# Patient Record
Sex: Male | Born: 1960 | Race: White | Hispanic: No | Smoking: Current every day smoker
Health system: Southern US, Community
[De-identification: ages and names within clinical notes are randomized; demographics above are authoritative.]

## PROBLEM LIST (undated history)

## (undated) DIAGNOSIS — M069 Rheumatoid arthritis, unspecified: Secondary | ICD-10-CM

## (undated) DIAGNOSIS — F10939 Alcohol use, unspecified with withdrawal, unspecified: Secondary | ICD-10-CM

## (undated) DIAGNOSIS — R569 Unspecified convulsions: Secondary | ICD-10-CM

## (undated) DIAGNOSIS — F1010 Alcohol abuse, uncomplicated: Secondary | ICD-10-CM

## (undated) DIAGNOSIS — F10239 Alcohol dependence with withdrawal, unspecified: Secondary | ICD-10-CM

## (undated) HISTORY — PX: KNEE SURGERY: SHX244

## (undated) HISTORY — DX: Rheumatoid arthritis, unspecified: M06.9

## (undated) HISTORY — PX: ANKLE SURGERY: SHX546

## (undated) HISTORY — PX: OTHER SURGICAL HISTORY: SHX169

## (undated) HISTORY — PX: LUMBAR SPINE SURGERY: SHX701

---

## 2001-02-12 ENCOUNTER — Encounter: Payer: Self-pay | Admitting: Emergency Medicine

## 2001-02-12 ENCOUNTER — Emergency Department (HOSPITAL_COMMUNITY): Admission: EM | Admit: 2001-02-12 | Discharge: 2001-02-12 | Payer: Self-pay

## 2002-08-26 ENCOUNTER — Encounter: Payer: Self-pay | Admitting: Orthopaedic Surgery

## 2002-08-26 ENCOUNTER — Ambulatory Visit (HOSPITAL_COMMUNITY): Admission: RE | Admit: 2002-08-26 | Discharge: 2002-08-26 | Payer: Self-pay | Admitting: Orthopaedic Surgery

## 2002-09-05 ENCOUNTER — Encounter: Payer: Self-pay | Admitting: Orthopedic Surgery

## 2002-09-05 ENCOUNTER — Encounter: Admission: RE | Admit: 2002-09-05 | Discharge: 2002-09-05 | Payer: Self-pay | Admitting: Orthopedic Surgery

## 2006-10-05 ENCOUNTER — Inpatient Hospital Stay (HOSPITAL_COMMUNITY): Admission: RE | Admit: 2006-10-05 | Discharge: 2006-10-07 | Payer: Self-pay | Admitting: Orthopaedic Surgery

## 2010-08-31 NOTE — Op Note (Signed)
NAME:  Larry Lozano, Larry Lozano NO.:  [id number]   MEDICAL RECORD NO.:  [id number]          PATIENT TYPE:  INP   LOCATION:  5010                         FACILITY:  [facility]   PHYSICIAN:  Sharolyn Douglas, M.D.        DATE OF BIRTH:  07/14/1960   DATE OF PROCEDURE:  10/05/2006  DATE OF DISCHARGE:                               OPERATIVE REPORT   PREOPERATIVE DIAGNOSES:  1. Lumbar degenerative disk disease.  2. Lumbar spondylosis  3. Lumbar spinal stenosis.  4. Chronic back and severe right leg pain.   PROCEDURE:  1. L3-4 and L4-5 laminectomy.  2. L3-4, L4-5 posterior spinal fusion.  3. L3-4 and L4-5 transforaminal lumbar interbody fusion with placement      of two 11-mm PEEK cages.  4. Segmental pedicle screw instrumentation L3-L5 using the Abbott      spine system.  5. Local autogenous bone graft supplemented with 10 mL of Vitoss bone      graft substitute and OP-1 BMP.  6. Bone marrow aspiration from the pedicles for spinal fusion.   SURGEON:  Sharolyn Douglas, M.D.   ASSISTANT:  Jill Side Mahar, PA   ANESTHESIA:  General endotracheal.   ESTIMATED BLOOD LOSS:  300 mL   COMPLICATIONS:  None.   COUNTS:  Needle and sponge count correct.   INDICATIONS:  The patient is a pleasant 50 year old male with chronic  progressive back and severe right leg pain.  He has failed other  attempts at conservative treatment.  His imaging studies show severe  spondylosis, most advanced at L3-4, L4-5 with degenerative scoliosis,  lateral recess and foraminal narrowing on the right side.  He now  presents for lumbar decompression and fusion from L3 to L5 in hopes of  improving his symptoms.  Risk, benefits, alternative reviewed.   </   DESCRIPTION OF PROCEDURE:  After informed consent he was taken to the  operating room.  He underwent general endotracheal anesthesia without  difficulty, given prophylactic IV antibiotics.  Neuro monitoring  established in the form of lower extremity EMGs and  SSEPs.  Carefully  turned prone onto the Wilson frame.  All bony prominences padded.  Face  and eyes protected at all times.  Back prepped, draped in the usual  sterile fashion.  Midline incision made from L3 down to L5.  Dissection  was carried to the deep fascia.  Careful subperiosteal exposure carried  out to the tips of the transverse processes of L3, L4 and L5  bilaterally.  Deep retractors placed.  Intraoperative x-ray was taken to  confirm the levels.  We then used loupes and headlight magnification to  complete a hemilaminotomy on the right side at L3-4 and L4-5.  The  ligamentum flavum was removed.  The lamina were undercut proximally and  across the midline, decompressing the thecal sac.  We identified the L4  and L5 nerve roots which were severely encumbered within the lateral  recesses.  The decompression was carried out flush with the pedicles.  The disks were bulging dorsally, further compromising the lateral recess  and also the neural foramen  at L3-4 and L4-5.  Once we were satisfied  with the laminectomy, we turned our attention to placing pedicle screws  at L3, L4 and L5 bilaterally using an anatomic probing technique.  The  pedicle holes were initiated with the awl.  The pedicles were cannulated  and then palpated with a ball feeler.  There were no breeches.  We then  tapped the pedicles and once again palpated, confirming no breeches.  We  placed 6.5 by a 45 mm screws.  We had good screw purchase.  After  placing the pedicle screws,  they were stimulated using EMGs.  We had  low readings on the left side at L3-L4.  Those screws were removed.  Once again palpated and again we did not detect any breeches.  The  screws were replaced.   At this point we turned our attention to performing transforaminal  lumbar interbody fusions at L3-4 and L4-5 on the right side to further  decompress the lateral recess and foramen and also provide additional  fusion bed.  The remaining  facette joints at L3-4 and  L4-5 were  osteotomized.  Free running EMGs were monitored.  The transforaminal  windows were created between the exiting and transversing nerve roots.  The structures were protected at all times.  The disk spaces were  entered and radical diskectomies were completed at both levels, scraping  the cartilaginous end plates.  We then dilated the disk up to 11 mm  using intervertebral spreaders.  The disk spaces were then packed with a  mixture of the Vitoss OP-1 BMP and local bone graft.  We then inserted  11 mm PEEK cages packed with the BMP/Vitoss mixture into the interspace,  tamped in the PEEK cages anteriorly and across the midline.  This was  performed at both the L3-4 and L4-5 levels.  We then completed the  posterior spinal fusion by decorticating the transverse processes of L3,  L4 and L5 and packing the remaining bone graft mixture into the lateral  gutters.  We placed 70 mm titanium rods into the polyaxial screw heads  and applied compression before shearing off the locking caps.  A cross  connector was placed.  Hemostasis was achieved.  Intraoperative x-ray  was taken which confirmed good positioning of the instrumentation from  L3 to L5.  We also had taken an initial x-ray to confirm our levels  after the initial exposure.  A deep Hemovac drain was left and the wound  was closed in layers using #1 Vicryl suture, 0 Vicryl and 2-0 Vicryl.  A  running 3-0 subcuticular Vicryl suture was placed in a subcuticular  fashion followed by Dermabond.  Sterile dressing was applied.  The  patient was turned supine, extubated without difficulty and transferred  to recovery in stable condition.   It should be noted my assistant [facility], PA was present throughout  the procedure.  She assisted with positioning.  She  assisted with the  exposure using the suction and the Cobb elevators.  She then worked  using loupes to help protect the dura using the __________  retractor  during the decompression and also providing suction.  She assisted me  with the instrumentation and the arthrodesis and then with wound  closure.      Sharolyn Douglas, M.D.  Electronically Signed     [facility]/MEDQ  D:  10/05/2006  T:  10/06/2006  Job:  [id number]

## 2011-02-02 LAB — BASIC METABOLIC PANEL
BUN: <1 — ABNORMAL LOW
CO2: 27
CO2: 27
Calcium: 8.9
Chloride: 104
Creatinine, Ser: 0.77
Creatinine, Ser: 0.87
Glucose, Bld: 111 — ABNORMAL HIGH

## 2011-02-02 LAB — URINALYSIS, ROUTINE W REFLEX MICROSCOPIC
Bilirubin Urine: NEGATIVE
Hgb urine dipstick: NEGATIVE
Ketones, ur: NEGATIVE
Specific Gravity, Urine: 1.017 (ref 1.005–1.035)
Urobilinogen, UA: 0.2

## 2011-02-02 LAB — TYPE AND SCREEN
ABO/RH(D): O POS
Antibody Screen: NEGATIVE

## 2011-02-02 LAB — CBC
HCT: 47
Hemoglobin: 16.2
MCHC: 34.5
MCV: 90.4
Platelets: 221
RBC: 5.2
RDW: 12.4
WBC: 8.4

## 2011-02-02 LAB — DIFFERENTIAL
Basophils Relative: 0
Eosinophils Absolute: 0.1
Lymphs Abs: 2
Neutrophils Relative %: 70

## 2011-02-02 LAB — COMPREHENSIVE METABOLIC PANEL
Albumin: 4.4
Alkaline Phosphatase: 68
BUN: 8
CO2: 28
Chloride: 105
GFR calc non Af Amer: >60
Glucose, Bld: 102 — ABNORMAL HIGH
Potassium: 4.3
Total Bilirubin: 0.8

## 2011-02-02 LAB — URINE CULTURE

## 2011-02-02 LAB — PROTIME-INR: INR: 0.9

## 2012-07-09 ENCOUNTER — Ambulatory Visit
Admission: RE | Admit: 2012-07-09 | Discharge: 2012-07-09 | Disposition: A | Discharge status: Home / Self Care | Payer: 59 | Source: Ambulatory Visit | Attending: Orthopedic Surgery | Admitting: Orthopedic Surgery

## 2012-07-09 ENCOUNTER — Other Ambulatory Visit: Payer: Self-pay | Admitting: Orthopedic Surgery

## 2012-07-09 DIAGNOSIS — M25561 Pain in right knee: Secondary | ICD-10-CM

## 2012-07-10 ENCOUNTER — Other Ambulatory Visit: Payer: Self-pay

## 2012-07-20 ENCOUNTER — Other Ambulatory Visit: Payer: 59

## 2016-12-02 ENCOUNTER — Inpatient Hospital Stay (HOSPITAL_COMMUNITY)
Admission: EM | Admit: 2016-12-02 | Discharge: 2016-12-04 | DRG: 897 | Disposition: A | Discharge status: Home / Self Care | Payer: BLUE CROSS/BLUE SHIELD | Attending: Nephrology | Admitting: Nephrology

## 2016-12-02 ENCOUNTER — Encounter (HOSPITAL_COMMUNITY): Payer: Self-pay | Admitting: *Deleted

## 2016-12-02 ENCOUNTER — Emergency Department (HOSPITAL_COMMUNITY): Payer: BLUE CROSS/BLUE SHIELD

## 2016-12-02 DIAGNOSIS — F10239 Alcohol dependence with withdrawal, unspecified: Principal | ICD-10-CM | POA: Diagnosis present

## 2016-12-02 DIAGNOSIS — Y900 Blood alcohol level of less than 20 mg/100 ml: Secondary | ICD-10-CM | POA: Diagnosis present

## 2016-12-02 DIAGNOSIS — R55 Syncope and collapse: Secondary | ICD-10-CM | POA: Diagnosis not present

## 2016-12-02 DIAGNOSIS — K709 Alcoholic liver disease, unspecified: Secondary | ICD-10-CM | POA: Diagnosis present

## 2016-12-02 DIAGNOSIS — D696 Thrombocytopenia, unspecified: Secondary | ICD-10-CM | POA: Diagnosis present

## 2016-12-02 DIAGNOSIS — G4089 Other seizures: Secondary | ICD-10-CM | POA: Diagnosis present

## 2016-12-02 DIAGNOSIS — Z888 Allergy status to other drugs, medicaments and biological substances status: Secondary | ICD-10-CM

## 2016-12-02 DIAGNOSIS — R569 Unspecified convulsions: Secondary | ICD-10-CM | POA: Diagnosis not present

## 2016-12-02 DIAGNOSIS — D6959 Other secondary thrombocytopenia: Secondary | ICD-10-CM | POA: Diagnosis present

## 2016-12-02 DIAGNOSIS — F1010 Alcohol abuse, uncomplicated: Secondary | ICD-10-CM | POA: Diagnosis not present

## 2016-12-02 HISTORY — DX: Alcohol dependence with withdrawal, unspecified: F10.239

## 2016-12-02 HISTORY — DX: Unspecified convulsions: R56.9

## 2016-12-02 HISTORY — DX: Alcohol use, unspecified with withdrawal, unspecified: F10.939

## 2016-12-02 HISTORY — DX: Alcohol abuse, uncomplicated: F10.10

## 2016-12-02 LAB — URINALYSIS, ROUTINE W REFLEX MICROSCOPIC
BILIRUBIN URINE: NEGATIVE
Glucose, UA: NEGATIVE mg/dL
Ketones, ur: 5 mg/dL — AB
LEUKOCYTES UA: NEGATIVE
Nitrite: NEGATIVE
Protein, ur: 100 mg/dL — AB
SPECIFIC GRAVITY, URINE: 1.02 (ref 1.005–1.030)
pH: 5 (ref 5.0–8.0)

## 2016-12-02 LAB — RAPID URINE DRUG SCREEN, HOSP PERFORMED
AMPHETAMINES: NOT DETECTED
BARBITURATES: NOT DETECTED
Benzodiazepines: NOT DETECTED
Cocaine: NOT DETECTED
Opiates: NOT DETECTED
TETRAHYDROCANNABINOL: NOT DETECTED

## 2016-12-02 LAB — CBC WITH DIFFERENTIAL/PLATELET
Basophils Absolute: 0 10*3/uL (ref 0.0–0.1)
Basophils Relative: 0 %
Eosinophils Absolute: 0 10*3/uL (ref 0.0–0.7)
Eosinophils Relative: 0 %
HEMATOCRIT: 39.1 % (ref 39.0–52.0)
Hemoglobin: 13.6 g/dL (ref 13.0–17.0)
LYMPHS PCT: 13 %
Lymphs Abs: 1 10*3/uL (ref 0.7–4.0)
MCH: 32 pg (ref 26.0–34.0)
MCHC: 34.8 g/dL (ref 30.0–36.0)
MCV: 92 fL (ref 78.0–100.0)
MONO ABS: 0.9 10*3/uL (ref 0.1–1.0)
MONOS PCT: 12 %
NEUTROS ABS: 5.7 10*3/uL (ref 1.7–7.7)
Neutrophils Relative %: 75 %
Platelets: 113 10*3/uL — ABNORMAL LOW (ref 150–400)
RBC: 4.25 MIL/uL (ref 4.22–5.81)
RDW: 13.6 % (ref 11.5–15.5)
WBC: 7.7 10*3/uL (ref 4.0–10.5)

## 2016-12-02 LAB — ETHANOL: Alcohol, Ethyl (B): <5 mg/dL (ref <5)

## 2016-12-02 LAB — I-STAT TROPONIN, ED: TROPONIN I, POC: 0 ng/mL (ref 0.00–0.08)

## 2016-12-02 LAB — COMPREHENSIVE METABOLIC PANEL
ALT: 59 U/L (ref 17–63)
ANION GAP: 18 — AB (ref 5–15)
AST: 72 U/L — AB (ref 15–41)
Albumin: 4.5 g/dL (ref 3.5–5.0)
Alkaline Phosphatase: 83 U/L (ref 38–126)
BILIRUBIN TOTAL: 1.3 mg/dL — AB (ref 0.3–1.2)
BUN: 6 mg/dL (ref 6–20)
CALCIUM: 10.3 mg/dL (ref 8.9–10.3)
CO2: 20 mmol/L — ABNORMAL LOW (ref 22–32)
Chloride: 99 mmol/L — ABNORMAL LOW (ref 101–111)
Creatinine, Ser: 1.04 mg/dL (ref 0.61–1.24)
GFR calc Af Amer: >60 mL/min (ref >60)
GFR calc non Af Amer: >60 mL/min (ref >60)
Glucose, Bld: 145 mg/dL — ABNORMAL HIGH (ref 65–99)
POTASSIUM: 3.7 mmol/L (ref 3.5–5.1)
Sodium: 137 mmol/L (ref 135–145)
TOTAL PROTEIN: 6.8 g/dL (ref 6.5–8.1)

## 2016-12-02 LAB — MAGNESIUM: MAGNESIUM: 1.4 mg/dL — AB (ref 1.7–2.4)

## 2016-12-02 LAB — CK: CK TOTAL: 130 U/L (ref 49–397)

## 2016-12-02 MED ORDER — THIAMINE HCL 100 MG/ML IJ SOLN: 100.0000 mg | Freq: Every day | INTRAMUSCULAR | Status: DC

## 2016-12-02 MED ORDER — SODIUM CHLORIDE 0.9 % IV BOLUS (SEPSIS)
1000.0000 mL | Freq: Once | INTRAVENOUS | Status: AC
  Administered 2016-12-02: 1000 mL via INTRAVENOUS

## 2016-12-02 MED ORDER — LORAZEPAM 2 MG/ML IJ SOLN
1.0000 mg | Freq: Once | INTRAMUSCULAR | Status: AC
  Administered 2016-12-02: 1 mg via INTRAVENOUS
  Filled 2016-12-02: qty 1

## 2016-12-02 MED ORDER — ACETAMINOPHEN 325 MG PO TABS: 650.0000 mg | ORAL_TABLET | Freq: Four times a day (QID) | ORAL | Status: DC | PRN

## 2016-12-02 MED ORDER — VITAMIN B-1 100 MG PO TABS
100.0000 mg | ORAL_TABLET | Freq: Every day | ORAL | Status: DC
  Administered 2016-12-03 – 2016-12-04 (×2): 100 mg via ORAL
  Filled 2016-12-02 (×2): qty 1

## 2016-12-02 MED ORDER — ENOXAPARIN SODIUM 40 MG/0.4ML ~~LOC~~ SOLN
40.0000 mg | SUBCUTANEOUS | Status: DC
  Administered 2016-12-02: 40 mg via SUBCUTANEOUS
  Filled 2016-12-02: qty 0.4

## 2016-12-02 MED ORDER — HYDROCODONE-ACETAMINOPHEN 5-325 MG PO TABS: 1.0000 | ORAL_TABLET | ORAL | Status: DC | PRN

## 2016-12-02 MED ORDER — SENNOSIDES-DOCUSATE SODIUM 8.6-50 MG PO TABS: 1.0000 | ORAL_TABLET | Freq: Every evening | ORAL | Status: DC | PRN

## 2016-12-02 MED ORDER — VITAMIN B-1 100 MG PO TABS: 100.0000 mg | ORAL_TABLET | Freq: Every day | ORAL | Status: DC

## 2016-12-02 MED ORDER — ONDANSETRON HCL 4 MG PO TABS: 4.0000 mg | ORAL_TABLET | Freq: Four times a day (QID) | ORAL | Status: DC | PRN

## 2016-12-02 MED ORDER — ADULT MULTIVITAMIN W/MINERALS CH
1.0000 | ORAL_TABLET | Freq: Every day | ORAL | Status: DC
  Administered 2016-12-03 – 2016-12-04 (×2): 1 via ORAL
  Filled 2016-12-02 (×2): qty 1

## 2016-12-02 MED ORDER — LORAZEPAM 2 MG/ML IJ SOLN
1.0000 mg | Freq: Four times a day (QID) | INTRAMUSCULAR | Status: DC | PRN
  Administered 2016-12-03: 1 mg via INTRAVENOUS
  Filled 2016-12-02: qty 1

## 2016-12-02 MED ORDER — LORAZEPAM 2 MG/ML IJ SOLN: 0.0000 mg | Freq: Two times a day (BID) | INTRAMUSCULAR | Status: DC

## 2016-12-02 MED ORDER — ADULT MULTIVITAMIN W/MINERALS CH: 1.0000 | ORAL_TABLET | Freq: Every day | ORAL | Status: DC

## 2016-12-02 MED ORDER — ACETAMINOPHEN 650 MG RE SUPP: 650.0000 mg | Freq: Four times a day (QID) | RECTAL | Status: DC | PRN

## 2016-12-02 MED ORDER — FOLIC ACID 1 MG PO TABS
1.0000 mg | ORAL_TABLET | Freq: Every day | ORAL | Status: DC
  Administered 2016-12-03 – 2016-12-04 (×2): 1 mg via ORAL
  Filled 2016-12-02 (×2): qty 1

## 2016-12-02 MED ORDER — SODIUM CHLORIDE 0.9 % IV SOLN
INTRAVENOUS | Status: AC
  Administered 2016-12-02 – 2016-12-03 (×2): via INTRAVENOUS

## 2016-12-02 MED ORDER — LORAZEPAM 2 MG/ML IJ SOLN: 0.0000 mg | Freq: Four times a day (QID) | INTRAMUSCULAR | Status: DC

## 2016-12-02 MED ORDER — ONDANSETRON HCL 4 MG/2ML IJ SOLN: 4.0000 mg | Freq: Four times a day (QID) | INTRAMUSCULAR | Status: DC | PRN

## 2016-12-02 MED ORDER — LORAZEPAM 1 MG PO TABS: 0.0000 mg | ORAL_TABLET | Freq: Two times a day (BID) | ORAL | Status: DC

## 2016-12-02 MED ORDER — LORAZEPAM 1 MG PO TABS
1.0000 mg | ORAL_TABLET | Freq: Four times a day (QID) | ORAL | Status: DC | PRN
  Administered 2016-12-04: 1 mg via ORAL
  Filled 2016-12-02: qty 1

## 2016-12-02 MED ORDER — FOLIC ACID 1 MG PO TABS: 1.0000 mg | ORAL_TABLET | Freq: Every day | ORAL | Status: DC

## 2016-12-02 MED ORDER — LORAZEPAM 1 MG PO TABS
0.0000 mg | ORAL_TABLET | Freq: Four times a day (QID) | ORAL | Status: DC
Start: 2016-12-02 — End: 2016-12-04
  Administered 2016-12-03 – 2016-12-04 (×2): 1 mg via ORAL
  Administered 2016-12-04: 3 mg via ORAL
  Filled 2016-12-02 (×3): qty 1
  Filled 2016-12-02: qty 3

## 2016-12-02 MED ORDER — SODIUM CHLORIDE 0.9% FLUSH
3.0000 mL | Freq: Two times a day (BID) | INTRAVENOUS | Status: DC
  Administered 2016-12-02 – 2016-12-03 (×2): 3 mL via INTRAVENOUS

## 2016-12-02 MED ORDER — LORAZEPAM 1 MG PO TABS: 1.0000 mg | ORAL_TABLET | Freq: Four times a day (QID) | ORAL | Status: DC | PRN

## 2016-12-02 MED ORDER — LORAZEPAM 2 MG/ML IJ SOLN: 1.0000 mg | Freq: Four times a day (QID) | INTRAMUSCULAR | Status: DC | PRN

## 2016-12-02 MED ORDER — BISACODYL 5 MG PO TBEC: 5.0000 mg | DELAYED_RELEASE_TABLET | Freq: Every day | ORAL | Status: DC | PRN

## 2016-12-02 NOTE — ED Provider Notes (Signed)
[facility]-EMERGENCY DEPT Provider Note   CSN: [id number] Arrival date & time: 12/02/16  1617     History   Chief Complaint Chief Complaint  Patient presents with  . Loss of Consciousness    HPI Larry Lozano is a 56 y.o. male history of chronic alcohol abuse, here presenting with possible seizure-like activity. Per the family, patient drinks alcohol daily but he denies it. 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 Patient did not remember what happened but that the coworkers witnessed tonic-clonic seizure-like activity for about a minute or so and then he was confused. He remember waking up in the ambulance. EMS noticed that he was hypertensive for about 160/80. He was given 300 ml NS en route, CBG was 138. Per the wife, he had alcohol withdrawal seizure in 2014 and was hospitalized at [facility]. He is not currently on seizure medicines.   The history is provided by the patient.    History reviewed. No pertinent past medical history.  There are no active problems to display for this patient.   History reviewed. No pertinent surgical history.     Home Medications    Prior to Admission medications   Not on File    Family History No family history on file.  Social History Social History  Substance Use Topics  . Smoking status: Current Every Day Smoker    Types: Cigarettes  . Smokeless tobacco: Not on file  . Alcohol use Yes     Allergies   Methocarbamol   Review of Systems Review of Systems  Cardiovascular: Positive for syncope.  Neurological: Positive for seizures.  All other systems reviewed and are negative.    Physical Exam Updated Vital Signs BP (!) 117/94   Pulse 76   Resp (!) 21   SpO2 98%   Physical Exam  Constitutional: He is oriented to person, place,  and time. He appears well-developed.  HENT:  Head: Normocephalic.  Mouth/Throat: Oropharynx is clear and moist.  Eyes: Pupils are equal, round, and reactive to light. Conjunctivae and EOM are normal.  Neck: Normal range of motion. Neck supple.  Cardiovascular: Normal rate.   Pulmonary/Chest: Effort normal and breath sounds normal. No respiratory distress. He has no wheezes.  Abdominal: Soft. Bowel sounds are normal. He exhibits no distension. There is no tenderness. There is no guarding.  Musculoskeletal: Normal range of motion.  Neurological: He is alert and oriented to person, place, and time. No cranial nerve deficit. Coordination normal.  CN 2-12 intact. Nl strength throughout   Skin: Skin is warm.  Psychiatric: He has a normal mood and affect.  Nursing note and vitals reviewed.    ED Treatments / Results  Labs (all labs ordered are listed, but only abnormal results are displayed) Labs Reviewed  URINALYSIS, ROUTINE W REFLEX MICROSCOPIC - Abnormal; Notable for the following:       Result Value   Color, Urine AMBER (*)    APPearance HAZY (*)    Hgb urine dipstick SMALL (*)    Ketones, ur 5 (*)    Protein, ur 100 (*)    Bacteria, UA RARE (*)    Squamous Epithelial / LPF 0-5 (*)    All other components within normal limits  CBC WITH DIFFERENTIAL/PLATELET - Abnormal; Notable for the following:    Platelets 113 (*)  All other components within normal limits  COMPREHENSIVE METABOLIC PANEL - Abnormal; Notable for the following:    Chloride 99 (*)    CO2 20 (*)    Glucose, Bld 145 (*)    AST 72 (*)    Total Bilirubin 1.3 (*)    Anion gap 18 (*)    All other components within normal limits  ETHANOL  CK  RAPID URINE DRUG SCREEN, HOSP PERFORMED  I-STAT TROPONIN, ED  CBG MONITORING, ED    EKG  EKG Interpretation  Date/Time:  Friday December 02 2016 16:58:55 EDT Ventricular Rate:  74 PR Interval:    QRS Duration: 106 QT Interval:  408 QTC Calculation: 453 R  Axis:   81 Text Interpretation:  Sinus rhythm No previous ECGs available Confirmed by Richardean Canal [phone]) on 12/02/2016 5:05:59 PM       Radiology Ct Head Wo Contrast  Result Date: 12/02/2016 CLINICAL DATA:  New onset seizure. EXAM: CT HEAD WITHOUT CONTRAST TECHNIQUE: Contiguous axial images were obtained from the base of the skull through the vertex without intravenous contrast. COMPARISON:  None. FINDINGS: Brain: Brain atrophy, advanced for age. No sign of old or acute focal infarction, mass lesion, hemorrhage, hydrocephalus or extra-axial collection. Vascular: There is atherosclerotic calcification of the major vessels at the base of the brain. Skull: Normal Sinuses/Orbits: Clear/normal Other: None IMPRESSION: Brain atrophy, advanced for age. No acute or focal finding otherwise. No specific cause of seizure identified. Electronically Signed   By: Paulina Fusi M.D.   On: 12/02/2016 18:10    Procedures Procedures (including critical care time)  Medications Ordered in ED Medications  LORazepam (ATIVAN) injection 1 mg (not administered)  LORazepam (ATIVAN) tablet 1 mg (not administered)    Or  LORazepam (ATIVAN) injection 1 mg (not administered)  thiamine (VITAMIN B-1) tablet 100 mg (not administered)    Or  thiamine (B-1) injection 100 mg (not administered)  folic acid (FOLVITE) tablet 1 mg (not administered)  multivitamin with minerals tablet 1 tablet (not administered)  LORazepam (ATIVAN) injection 0-4 mg (not administered)    Followed by  LORazepam (ATIVAN) injection 0-4 mg (not administered)  sodium chloride 0.9 % bolus 1,000 mL (1,000 mLs Intravenous New Bag/Given 12/02/16 1700)     Initial Impression / Assessment and Plan / ED Course  I have reviewed the triage vital signs and the nursing notes.  Pertinent labs & imaging results that were available during my care of the patient were reviewed by me and considered in my medical decision making (see chart for details).      Larry Lozano is a 56 y.o. male here with syncope vs seizure. Has long standing alcohol abuse per wife and had previous alcohol withdrawal seizures. Will get labs, CT head, ETOH and tox. Will hydrate and reassess.   6:35 PM ETOH neg. Some ketones in urine. He now admits to drinking every night, about 12 beers a night. CT head unremarkable. Started having tremors now. Ordered ativan, started on CIWA protocol. I am concerned for possible alcohol withdrawal seizures vs dehydration vs near syncope. Will observe in the hospital.   Final Clinical Impressions(s) / ED Diagnoses   Final diagnoses:  None    New Prescriptions New Prescriptions   No medications on file     Charlynne Pander, MD 12/02/16 [phone]

## 2016-12-02 NOTE — ED Notes (Signed)
Attempted to call report

## 2016-12-02 NOTE — H&P (Signed)
History and Physical    Larry Lozano ZOX:[id number] DOB: 07-10-1960 DOA: 12/02/2016  PCP: Patient, No Pcp Per   Patient coming from: Home  Chief Complaint: Loss of consciousness, confusion   HPI: Larry Lozano is a 56 y.o. male with medical history significant for alcohol abuse and history of alcohol withdrawal seizure in 2014, no present to the emergency department after transient loss of consciousness at his workplace. Patient reports waking in his usual state of health, not eating any breakfast or having anything to drink before going to work in the morning. While working, he was noted by coworkers to lose consciousness and had some brief shaking of all of his extremities. He reportedly regained consciousness within about a minute but was confused afterwards. EMS was called out. Patient was found to be mildly hypertensive and confused, with vitals otherwise stable. He initially denies any alcohol use, but his wife at the bedside reports that he is a daily drinker. The patient later acknowledges having drinks last night. No recent fevers or chills and no chest pain or palpitations. Denies headache, change in vision or hearing, focal numbness or weakness, or loss of coordination.  ED Course: Upon arrival to the ED, patient is found to be saturating well on room air, and with vitals stable. Orthostatic vitals were negative. EKG features a normal sinus rhythm and noncontrast head CT demonstrates atrophy that is advanced for age, but no acute intracranial abnormality. Chemistry panel features and AST of 72 and total bilirubin of 1.3. CBC was notable for a thrombocytopenia with platelets 113,000. Ethanol level was undetectable, UDS is negative, urinalysis is unremarkable, and troponin is undetectable. Patient was treated with a liter of normal saline, V vitamins, and Ativan in the ED. He remained hemodynamically stable and in no apparent respiratory distress, and he will be observed on the telemetry  unit for ongoing evaluation and management of transient loss of consciousness suspected to be an alcohol withdrawal seizure versus syncope.  Review of Systems:  All other systems reviewed and apart from HPI, are negative.  History reviewed. No pertinent past medical history.  History reviewed. No pertinent surgical history.   reports that he has been smoking Cigarettes.  He does not have any smokeless tobacco history on file. He reports that he drinks alcohol. His drug history is not on file.  Allergies  Allergen Reactions  . Methocarbamol Itching    Reaction to Robaxin    History reviewed. No pertinent family history.   Prior to Admission medications   Not on File    Physical Exam: Vitals:   12/02/16 1700 12/02/16 1730 12/02/16 1745 12/02/16 1800  BP: 132/87 129/68 138/81 (!) 117/94  Pulse: 75 79 79 76  Resp: 15 (!) 26 20 (!) 21  SpO2: 97% 99% 99% 98%      Constitutional: NAD, calm, comfortable Eyes: PERTLA, lids and conjunctivae normal ENMT: Mucous membranes are dry. Posterior pharynx clear of any exudate or lesions.   Neck: normal, supple, no masses, no thyromegaly Respiratory: clear to auscultation bilaterally, no wheezing, no crackles. Normal respiratory effort.  Cardiovascular: S1 & S2 heard, regular rate and rhythm. No extremity edema. No significant JVD. Abdomen: No distension, no tenderness, no masses palpated. Bowel sounds normal.  Musculoskeletal: no clubbing / cyanosis. No joint deformity upper and lower extremities.   Skin: no significant rashes, lesions, ulcers. Warm, dry, well-perfused. Poor turgor.  Neurologic: CN 2-12 grossly intact. Sensation intact, DTR normal. Strength 5/5 in all 4 limbs. Resting tremor to  bilateral UE's Psychiatric: Alert and oriented x 3. Calm, cooperative.     Labs on Admission: I have personally reviewed following labs and imaging studies  CBC:  Recent Labs Lab 12/02/16 1631  WBC 7.7  NEUTROABS 5.7  HGB 13.6  HCT 39.1   MCV 92.0  PLT 113*   Basic Metabolic Panel:  Recent Labs Lab 12/02/16 1631  NA 137  K 3.7  CL 99*  CO2 20*  GLUCOSE 145*  BUN 6  CREATININE 1.04  CALCIUM 10.3   GFR: CrCl cannot be calculated (Unknown ideal weight.). Liver Function Tests:  Recent Labs Lab 12/02/16 1631  AST 72*  ALT 59  ALKPHOS 83  BILITOT 1.3*  PROT 6.8  ALBUMIN 4.5   No results for input(s): LIPASE, AMYLASE in the last 168 hours. No results for input(s): AMMONIA in the last 168 hours. Coagulation Profile: No results for input(s): INR, PROTIME in the last 168 hours. Cardiac Enzymes:  Recent Labs Lab 12/02/16 1631  CKTOTAL 130   BNP (last 3 results) No results for input(s): PROBNP in the last 8760 hours. HbA1C: No results for input(s): HGBA1C in the last 72 hours. CBG: No results for input(s): GLUCAP in the last 168 hours. Lipid Profile: No results for input(s): CHOL, HDL, LDLCALC, TRIG, CHOLHDL, LDLDIRECT in the last 72 hours. Thyroid Function Tests: No results for input(s): TSH, T4TOTAL, FREET4, T3FREE, THYROIDAB in the last 72 hours. Anemia Panel: No results for input(s): VITAMINB12, FOLATE, FERRITIN, TIBC, IRON, RETICCTPCT in the last 72 hours. Urine analysis:    Component Value Date/Time   COLORURINE AMBER (A) 12/02/2016 1702   APPEARANCEUR HAZY (A) 12/02/2016 1702   LABSPEC 1.020 12/02/2016 1702   PHURINE 5.0 12/02/2016 1702   GLUCOSEU NEGATIVE 12/02/2016 1702   HGBUR SMALL (A) 12/02/2016 1702   BILIRUBINUR NEGATIVE 12/02/2016 1702   KETONESUR 5 (A) 12/02/2016 1702   PROTEINUR 100 (A) 12/02/2016 1702   UROBILINOGEN 0.2 10/03/2006 1026   NITRITE NEGATIVE 12/02/2016 1702   LEUKOCYTESUR NEGATIVE 12/02/2016 1702   Sepsis Labs: @LABRCNTIP (procalcitonin:4,lacticidven:4) )No results found for this or any previous visit (from the past 240 hour(s)).   Radiological Exams on Admission: Ct Head Wo Contrast  Result Date: 12/02/2016 CLINICAL DATA:  New onset seizure. EXAM: CT  HEAD WITHOUT CONTRAST TECHNIQUE: Contiguous axial images were obtained from the base of the skull through the vertex without intravenous contrast. COMPARISON:  None. FINDINGS: Brain: Brain atrophy, advanced for age. No sign of old or acute focal infarction, mass lesion, hemorrhage, hydrocephalus or extra-axial collection. Vascular: There is atherosclerotic calcification of the major vessels at the base of the brain. Skull: Normal Sinuses/Orbits: Clear/normal Other: None IMPRESSION: Brain atrophy, advanced for age. No acute or focal finding otherwise. No specific cause of seizure identified. Electronically Signed   By: Paulina Fusi M.D.   On: 12/02/2016 18:10    EKG: Independently reviewed. Normal sinus rhythm.   Assessment/Plan  1. Transient loss of consciousness  - Pt had a LOC at work, lasting ~1 minute, had shaking of all extremities per coworkers, and was confused afterwards  - Hx of similar in 2014 attributed to EtOH-withdrawal seizure  - Head CT is negative for acute intracranial abnormality and no focal neurologic deficits identified  - Basic blood work largely unremarkable  - Suspect this was an alcohol-withdrawal seizure, possible syncope   - Plan to obtain MRI brain and EEG, check echocardiogram, continue cardiac monitoring, and address EtOH-withdrawal as below   2. Alcohol abuse with withdrawal  -  Pt is a daily drinker with hx of withdrawal seizure in 2014  - EtOH level undetectable in ED, reports last drink was the night before  - Tremulous in ED and treated with Ativan   - Continue to monitor with CIWA and treat with prn Ativan, vitamins, minerals    3. Thrombocytopenia  - Platelets 113k on admission - No bleeding  - Likely secondary to alcohol abuse    DVT prophylaxis: sq Lovenox  Code Status: Full  Family Communication: Wife updated at bedside Disposition Plan: Observe on telemetry Consults called: None Admission status: Observation    Briscoe Deutscher, MD [facility] Pager [phone]  If 7PM-7AM, please contact night-coverage [email or web address] Password [facility]  12/02/2016, 7:14 PM

## 2016-12-02 NOTE — ED Notes (Signed)
Lowella Bandy, RN on 5W made aware of pt CIWA and ativan given. Also made aware pt is unsteady on feet and made a fall risk.

## 2016-12-02 NOTE — ED Triage Notes (Signed)
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  He was unconscious for "a few minutes" per ems.  Pt did not have any seizure like activity per ems.  Pt has been confused for ems.  No focal weakness. EKG wnl, cbg 138.  Pt has 18g in LAC and had NS.  VSS (hypertensive for ems)

## 2016-12-02 NOTE — Progress Notes (Signed)
Larry Lozano [id number] Admitted to 4U98: 12/02/2016 8:53 PM Attending Provider: Briscoe Deutscher, MD    Larry Lozano is a 56 y.o. male patient admitted from ED awake, alert  & orientated  X 3,  Full Code, VSS - Blood pressure (!) 143/82, pulse 71, temperature 98.8 F (37.1 C), temperature source Oral, resp. rate 16, SpO2 99 %.,RA no c/o shortness of breath, no c/o chest pain, no distress noted. Tele # [id number] placed and pt is currently running:SR   IV site WDL:  with a transparent dsg that's clean dry and intact.  Allergies:   Allergies  Allergen Reactions  . Methocarbamol Itching    Reaction to Robaxin     Past Medical History:  Diagnosis Date  . Alcohol abuse   . Alcohol withdrawal seizure (HCC)     History:  obtained from patient/wife  Pt orientation to unit, room and routine. Information packet given to patient/family.  Admission INP armband ID verified with patient/family, and in place. SR up x 2, fall risk assessment complete with Patient and family verbalizing understanding of risks associated with falls. Pt verbalizes an understanding of how to use the call bell and to call for help before getting out of bed.  Skin, dirty, dry- abrasions Left forearm.  Scattered bruises.     Will cont to monitor and assist as needed.  Elisha Ponder, RN 12/02/2016 8:53 PM

## 2016-12-03 ENCOUNTER — Observation Stay (HOSPITAL_COMMUNITY): Payer: BLUE CROSS/BLUE SHIELD

## 2016-12-03 ENCOUNTER — Observation Stay (HOSPITAL_BASED_OUTPATIENT_CLINIC_OR_DEPARTMENT_OTHER): Payer: BLUE CROSS/BLUE SHIELD

## 2016-12-03 ENCOUNTER — Encounter (HOSPITAL_COMMUNITY): Payer: Self-pay | Admitting: *Deleted

## 2016-12-03 DIAGNOSIS — K709 Alcoholic liver disease, unspecified: Secondary | ICD-10-CM | POA: Diagnosis present

## 2016-12-03 DIAGNOSIS — F1010 Alcohol abuse, uncomplicated: Secondary | ICD-10-CM | POA: Diagnosis not present

## 2016-12-03 DIAGNOSIS — Y900 Blood alcohol level of less than 20 mg/100 ml: Secondary | ICD-10-CM | POA: Diagnosis present

## 2016-12-03 DIAGNOSIS — Z888 Allergy status to other drugs, medicaments and biological substances status: Secondary | ICD-10-CM | POA: Diagnosis not present

## 2016-12-03 DIAGNOSIS — D696 Thrombocytopenia, unspecified: Secondary | ICD-10-CM | POA: Diagnosis not present

## 2016-12-03 DIAGNOSIS — R55 Syncope and collapse: Secondary | ICD-10-CM | POA: Diagnosis present

## 2016-12-03 DIAGNOSIS — D6959 Other secondary thrombocytopenia: Secondary | ICD-10-CM | POA: Diagnosis present

## 2016-12-03 DIAGNOSIS — R569 Unspecified convulsions: Secondary | ICD-10-CM | POA: Diagnosis present

## 2016-12-03 DIAGNOSIS — F10239 Alcohol dependence with withdrawal, unspecified: Secondary | ICD-10-CM | POA: Diagnosis present

## 2016-12-03 DIAGNOSIS — G4089 Other seizures: Secondary | ICD-10-CM | POA: Diagnosis present

## 2016-12-03 LAB — CBC
HEMATOCRIT: 37.8 % — AB (ref 39.0–52.0)
Hemoglobin: 12.9 g/dL — ABNORMAL LOW (ref 13.0–17.0)
MCH: 31.6 pg (ref 26.0–34.0)
MCHC: 34.1 g/dL (ref 30.0–36.0)
MCV: 92.6 fL (ref 78.0–100.0)
Platelets: 88 10*3/uL — ABNORMAL LOW (ref 150–400)
RBC: 4.08 MIL/uL — AB (ref 4.22–5.81)
RDW: 13.8 % (ref 11.5–15.5)
WBC: 6.6 10*3/uL (ref 4.0–10.5)

## 2016-12-03 LAB — COMPREHENSIVE METABOLIC PANEL
ALT: 52 U/L (ref 17–63)
AST: 63 U/L — AB (ref 15–41)
Albumin: 3.9 g/dL (ref 3.5–5.0)
Alkaline Phosphatase: 77 U/L (ref 38–126)
Anion gap: 12 (ref 5–15)
BUN: 5 mg/dL — AB (ref 6–20)
CHLORIDE: 105 mmol/L (ref 101–111)
CO2: 21 mmol/L — AB (ref 22–32)
CREATININE: 0.73 mg/dL (ref 0.61–1.24)
Calcium: 9.7 mg/dL (ref 8.9–10.3)
GFR calc Af Amer: >60 mL/min (ref >60)
GFR calc non Af Amer: >60 mL/min (ref >60)
Glucose, Bld: 81 mg/dL (ref 65–99)
POTASSIUM: 3.7 mmol/L (ref 3.5–5.1)
SODIUM: 138 mmol/L (ref 135–145)
Total Bilirubin: 1.5 mg/dL — ABNORMAL HIGH (ref 0.3–1.2)
Total Protein: 6.2 g/dL — ABNORMAL LOW (ref 6.5–8.1)

## 2016-12-03 LAB — HIV ANTIBODY (ROUTINE TESTING W REFLEX): HIV Screen 4th Generation wRfx: NONREACTIVE

## 2016-12-03 LAB — ECHOCARDIOGRAM COMPLETE: Weight: 2160 oz

## 2016-12-03 MED ORDER — POTASSIUM CHLORIDE IN NACL 20-0.9 MEQ/L-% IV SOLN
INTRAVENOUS | Status: DC
  Administered 2016-12-03 – 2016-12-04 (×2): via INTRAVENOUS
  Filled 2016-12-03 (×2): qty 1000

## 2016-12-03 NOTE — Progress Notes (Signed)
  Echocardiogram 2D Echocardiogram has been performed.  Delcie Roch 12/03/2016, 3:46 PM

## 2016-12-03 NOTE — Progress Notes (Signed)
PROGRESS NOTE    Larry Lozano  ZOX:[id number] DOB: Jun 26, 1960 DOA: 12/02/2016 PCP: Patient, No Pcp Per   Brief Narrative: 56 y.o. male with medical history significant for alcohol abuse and history of alcohol withdrawal seizure in 2014,  presented to the emergency department after transient loss of consciousness at his workplace. While working, he was noted by coworkers to lose consciousness and had some brief shaking of all of his extremities. He reportedly regained consciousness within about a minute but was confused afterwards.  Assessment & Plan:   #  Transient loss of consciousness: -Likely in the setting of alcohol withdrawal ? Seizure. Patient has now mentally status improved. Follow-up MRI, EEG and echocardiogram. Continue supportive care.  # Alcohol abuse with withdrawal: Patient was tremulous this morning. He drinks heavily every day. Continue vitamin, Ativan and supportive care. Patient was counseled. 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 No sign of bleeding. Monitor labs.  DVT prophylaxis: Discontinue Lovenox since patient has thrombocytopenia. SCD Code Status: Full code Family Communication:Discussed with the patient's wife Disposition Plan: Currently admitted    Consultants:   None  Procedures: None Antimicrobials: None  Subjective: Seen and examined at bedside. Denied headache, dizziness, nausea vomiting chest pain shortness of breath. Patient's wife at bedside  Objective: Vitals:   12/02/16 2033 12/02/16 2354 12/03/16 0512 12/03/16 1006  BP: (!) 143/82 132/73 131/72 133/75  Pulse: 71 69 60 64  Resp:  18 18   Temp: 98.8 F (37.1 C) 98.7 F (37.1 C) 98.6 F (37 C)   TempSrc: Oral Oral Oral   SpO2: 99% 99% 97%     Intake/Output Summary (Last 24 hours) at 12/03/16 1255 Last data filed at 12/03/16 0920  Gross per 24 hour  Intake             1000 ml  Output              665 ml  Net               335 ml   There were no vitals filed for this visit.  Examination:  General exam: Appears mildly tremulous otherwise not in distress  Respiratory system: Clear to auscultation. Respiratory effort normal. No wheezing or crackle Cardiovascular system: S1 & S2 heard, RRR.  No pedal edema. Gastrointestinal system: Abdomen is nondistended, soft and nontender. Normal bowel sounds heard. Central nervous system: Alert and oriented. No focal neurological deficits. Extremities: Symmetric 5 x 5 power. Skin: No rashes, lesions or ulcers Psychiatry: Judgement and insight appear normal. Mood & affect appropriate.     Data Reviewed: I have personally reviewed following labs and imaging studies  CBC:  Recent Labs Lab 12/02/16 1631 12/03/16 0457  WBC 7.7 6.6  NEUTROABS 5.7  --   HGB 13.6 12.9*  HCT 39.1 37.8*  MCV 92.0 92.6  PLT 113* 88*   Basic Metabolic Panel:  Recent Labs Lab 12/02/16 1631 12/03/16 0457  NA 137 138  K 3.7 3.7  CL 99* 105  CO2 20* 21*  GLUCOSE 145* 81  BUN 6 5*  CREATININE 1.04 0.73  CALCIUM 10.3 9.7  MG 1.4*  --    GFR: CrCl cannot be calculated (Unknown ideal weight.). Liver Function Tests:  Recent Labs Lab 12/02/16 1631 12/03/16 0457  AST 72* 63*  ALT 59 52  ALKPHOS 83 77  BILITOT 1.3* 1.5*  PROT 6.8 6.2*  ALBUMIN 4.5 3.9   No results for input(s):  LIPASE, AMYLASE in the last 168 hours. No results for input(s): AMMONIA in the last 168 hours. Coagulation Profile: No results for input(s): INR, PROTIME in the last 168 hours. Cardiac Enzymes:  Recent Labs Lab 12/02/16 1631  CKTOTAL 130   BNP (last 3 results) No results for input(s): PROBNP in the last 8760 hours. HbA1C: No results for input(s): HGBA1C in the last 72 hours. CBG: No results for input(s): GLUCAP in the last 168 hours. Lipid Profile: No results for input(s): CHOL, HDL, LDLCALC, TRIG, CHOLHDL, LDLDIRECT in the last 72 hours. Thyroid Function Tests: No results for  input(s): TSH, T4TOTAL, FREET4, T3FREE, THYROIDAB in the last 72 hours. Anemia Panel: No results for input(s): VITAMINB12, FOLATE, FERRITIN, TIBC, IRON, RETICCTPCT in the last 72 hours. Sepsis Labs: No results for input(s): PROCALCITON, LATICACIDVEN in the last 168 hours.  No results found for this or any previous visit (from the past 240 hour(s)).       Radiology Studies: Ct Head Wo Contrast  Result Date: 12/02/2016 CLINICAL DATA:  New onset seizure. EXAM: CT HEAD WITHOUT CONTRAST TECHNIQUE: Contiguous axial images were obtained from the base of the skull through the vertex without intravenous contrast. COMPARISON:  None. FINDINGS: Brain: Brain atrophy, advanced for age. No sign of old or acute focal infarction, mass lesion, hemorrhage, hydrocephalus or extra-axial collection. Vascular: There is atherosclerotic calcification of the major vessels at the base of the brain. Skull: Normal Sinuses/Orbits: Clear/normal Other: None IMPRESSION: Brain atrophy, advanced for age. No acute or focal finding otherwise. No specific cause of seizure identified. Electronically Signed   By: Paulina Fusi M.D.   On: 12/02/2016 18:10   Mr Brain Wo Contrast  Result Date: 12/03/2016 CLINICAL DATA:  Blackout episode at work yesterday. Altered level of consciousness. EXAM: MRI HEAD WITHOUT CONTRAST TECHNIQUE: Multiplanar, multiecho pulse sequences of the brain and surrounding structures were obtained without intravenous contrast. COMPARISON:  Head CT from yesterday FINDINGS: Brain: No evidence of acute infarction, hemorrhage, hydrocephalus, extra-axial collection or mass lesion. Age advanced generalized atrophy. Vascular: Where seen, flow voids are patent. Skull and upper cervical spine: Negative Sinuses/Orbits: Negative Other: Proteinaceous retention cysts in the nasopharynx. Persistent excessive motion degradation which could easily obscure pathology. IMPRESSION: 1. Persistent motion degradation which could easily  obscure pathology. 2. No acute finding. 3. Age advanced atrophy. Electronically Signed   By: Marnee Spring M.D.   On: 12/03/2016 12:17        Scheduled Meds: . enoxaparin (LOVENOX) injection  40 mg Subcutaneous Q24H  . folic acid  1 mg Oral Daily  . LORazepam  0-4 mg Oral Q6H   Followed by  . [START ON 12/04/2016] LORazepam  0-4 mg Oral Q12H  . multivitamin with minerals  1 tablet Oral Daily  . sodium chloride flush  3 mL Intravenous Q12H  . thiamine  100 mg Oral Daily   Or  . thiamine  100 mg Intravenous Daily   Continuous Infusions:   LOS: 0 days    Nelta Caudill Jaynie Collins, MD [facility] Pager [phone]  If 7PM-7AM, please contact night-coverage [email or web address] Password [facility] 12/03/2016, 12:55 PM

## 2016-12-03 NOTE — Progress Notes (Signed)
Patient in MRI, EEG will be completed 12-05-16 as schedule permits

## 2016-12-04 MED ORDER — HALOPERIDOL LACTATE 5 MG/ML IJ SOLN
1.0000 mg | Freq: Once | INTRAMUSCULAR | Status: AC
  Administered 2016-12-04: 1 mg via INTRAVENOUS
  Filled 2016-12-04: qty 1

## 2016-12-04 MED ORDER — ADULT MULTIVITAMIN W/MINERALS CH: 1.0000 | ORAL_TABLET | Freq: Every day | ORAL | 0 refills | Status: DC

## 2016-12-04 NOTE — Discharge Summary (Signed)
Physician Discharge Summary  Larry Lozano:[id number] DOB: 1960/06/24 DOA: 12/02/2016  PCP: Patient, No Pcp Per  Admit date: 12/02/2016 Discharge date: 12/04/2016  Admitted From:home Disposition:home  Recommendations for Outpatient Follow-up:  1. Follow up with PCP in 1-2 weeks 2. Please obtain BMP/CBC in one week   Home Health:no Equipment/Devices:no Discharge Condition:stable CODE STATUS:full code Diet recommendation:heart healthy  Brief/Interim Summary: 56 y.o.malewith medical history significant for alcohol abuse and history of alcohol withdrawal seizure in 2014,  presented to the emergency department after transient loss of consciousness at his workplace. While working, he was noted by coworkers to lose consciousness. He reportedly regained consciousness within about a minute but was confused afterwards.  #  Transient loss of consciousness: -Likely in the setting of alcohol withdrawal  Vs vasovagal syncope. Patient's mental status improves. MRI and echocardiogram unremarkable. Denied headache, dizziness, nausea vomiting chest pain or shortness of breath. Patient lives with his wife.  # Alcohol abuse with withdrawal: Patient was counseled. He understand community resources to help quit alcohol. Started vitamins.No sign of withdrawal today. 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 No sign of bleeding. Recommended to follow up with PCP and monitor labs.  Discharge Diagnoses:  Principal Problem:   Transient loss of consciousness Active Problems:   Alcohol abuse   Thrombocytopenia [facility])    Discharge Instructions  Discharge Instructions    Call MD for:  difficulty breathing, headache or visual disturbances    Complete by:  As directed    Call MD for:  extreme fatigue    Complete by:  As directed    Call MD for:  hives    Complete by:  As directed    Call MD for:  persistant dizziness or light-headedness    Complete by:  As  directed    Call MD for:  persistant nausea and vomiting    Complete by:  As directed    Call MD for:  severe uncontrolled pain    Complete by:  As directed    Call MD for:  temperature >100.4    Complete by:  As directed    Diet - low sodium heart healthy    Complete by:  As directed    Increase activity slowly    Complete by:  As directed      Allergies as of 12/04/2016      Reactions   Methocarbamol Itching   Reaction to Robaxin      Medication List    TAKE these medications   multivitamin with minerals Tabs tablet Take 1 tablet by mouth daily.      Follow-up Information    [facility]. Schedule an appointment as soon as possible for a visit in 2 week(s).   Contact information: [address] [place] [address] [phone]         Allergies  Allergen Reactions  . Methocarbamol Itching    Reaction to Robaxin    Consultations: None  Procedures/Studies: MRI, echo  Subjective: Seen and examined at bedside. Denied headache, dizziness, nausea vomiting chest pain shortness of breath. Able to tolerate diet well.  Discharge Exam: Vitals:   12/04/16 0552 12/04/16 0922  BP: 130/76 (!) 132/94  Pulse: (!) 51 68  Resp:    Temp:    SpO2: 99%    Vitals:   12/04/16 0040 12/04/16 0459 12/04/16 0552 12/04/16 0922  BP:  116/89 130/76 (!) 132/94  Pulse: 60 63 (!) 51 68  Resp:  18    Temp:  97.6 F (36.4 C)    TempSrc:  Oral    SpO2:  100% 99%   Height:        General: Pt is alert, awake, not in acute distress Cardiovascular: RRR, S1/S2 +, no rubs, no gallops Respiratory: CTA bilaterally, no wheezing, no rhonchi Abdominal: Soft, NT, ND, bowel sounds + Extremities: no edema, no cyanosis    The results of significant diagnostics from this hospitalization (including imaging, microbiology, ancillary and laboratory) are listed below for reference.     Microbiology: No results found for this or any  previous visit (from the past 240 hour(s)).   Labs: BNP (last 3 results) No results for input(s): BNP in the last 8760 hours. Basic Metabolic Panel:  Recent Labs Lab 12/02/16 1631 12/03/16 0457  NA 137 138  K 3.7 3.7  CL 99* 105  CO2 20* 21*  GLUCOSE 145* 81  BUN 6 5*  CREATININE 1.04 0.73  CALCIUM 10.3 9.7  MG 1.4*  --    Liver Function Tests:  Recent Labs Lab 12/02/16 1631 12/03/16 0457  AST 72* 63*  ALT 59 52  ALKPHOS 83 77  BILITOT 1.3* 1.5*  PROT 6.8 6.2*  ALBUMIN 4.5 3.9   No results for input(s): LIPASE, AMYLASE in the last 168 hours. No results for input(s): AMMONIA in the last 168 hours. CBC:  Recent Labs Lab 12/02/16 1631 12/03/16 0457  WBC 7.7 6.6  NEUTROABS 5.7  --   HGB 13.6 12.9*  HCT 39.1 37.8*  MCV 92.0 92.6  PLT 113* 88*   Cardiac Enzymes:  Recent Labs Lab 12/02/16 1631  CKTOTAL 130   BNP: Invalid input(s): POCBNP CBG: No results for input(s): GLUCAP in the last 168 hours. D-Dimer No results for input(s): DDIMER in the last 72 hours. Hgb A1c No results for input(s): HGBA1C in the last 72 hours. Lipid Profile No results for input(s): CHOL, HDL, LDLCALC, TRIG, CHOLHDL, LDLDIRECT in the last 72 hours. Thyroid function studies No results for input(s): TSH, T4TOTAL, T3FREE, THYROIDAB in the last 72 hours.  Invalid input(s): FREET3 Anemia work up No results for input(s): VITAMINB12, FOLATE, FERRITIN, TIBC, IRON, RETICCTPCT in the last 72 hours. Urinalysis    Component Value Date/Time   COLORURINE AMBER (A) 12/02/2016 1702   APPEARANCEUR HAZY (A) 12/02/2016 1702   LABSPEC 1.020 12/02/2016 1702   PHURINE 5.0 12/02/2016 1702   GLUCOSEU NEGATIVE 12/02/2016 1702   HGBUR SMALL (A) 12/02/2016 1702   BILIRUBINUR NEGATIVE 12/02/2016 1702   KETONESUR 5 (A) 12/02/2016 1702   PROTEINUR 100 (A) 12/02/2016 1702   UROBILINOGEN 0.2 10/03/2006 1026   NITRITE NEGATIVE 12/02/2016 1702   LEUKOCYTESUR NEGATIVE 12/02/2016 1702   Sepsis  Labs Invalid input(s): PROCALCITONIN,  WBC,  LACTICIDVEN Microbiology No results found for this or any previous visit (from the past 240 hour(s)).   Time coordinating discharge: 27 minutes  SIGNED:   Maxie Barb, MD  [facility] 12/04/2016, 11:35 AM  If 7PM-7AM, please contact night-coverage [email or web address] Password [email or web address]

## 2016-12-04 NOTE — Progress Notes (Signed)
Larry Lozano to be D/C'd Home per MD order.  Discussed with the patient and all questions fully answered.  VSS, Skin clean, dry and intact without evidence of skin break down, no evidence of skin tears noted. IV catheter discontinued intact. Site without signs and symptoms of complications. Dressing and pressure applied.  An After Visit Summary was printed and given to the patient. Patient received prescription.  D/c education completed with patient/family including follow up instructions, medication list, d/c activities limitations if indicated, with other d/c instructions as indicated by MD - patient able to verbalize understanding, all questions fully answered.   Patient instructed to return to ED, call 911, or call MD for any changes in condition.   Patient escorted via WC, and D/C home via private auto.  Evern Bio 12/04/2016 2:00 PM

## 2018-03-21 IMAGING — CT CT HEAD W/O CM
4 series · 16 of 47 positions shown, 18 images · non-contrast
Comparison: None.

CLINICAL DATA: New onset seizure.

EXAM:
CT HEAD WITHOUT CONTRAST
TECHNIQUE: Contiguous axial images were obtained from the base of the skull
through the vertex without intravenous contrast.

[Series 3: head without · axial · non-contrast · 0.43mm/px · z∈[-113,+7]mm · 7 of 33 slices shown, 9 images]
[im 5/33  brain]
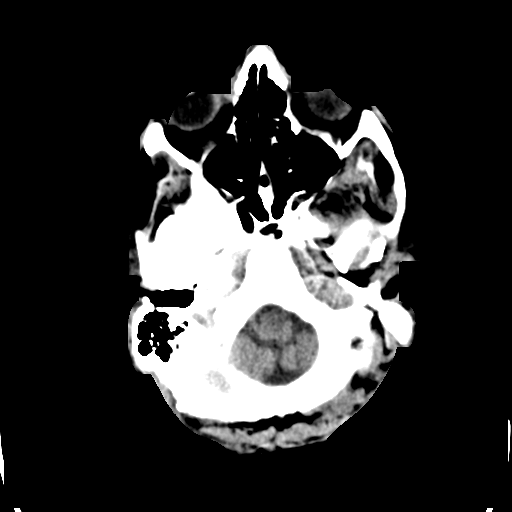
[im 5/33  bone]
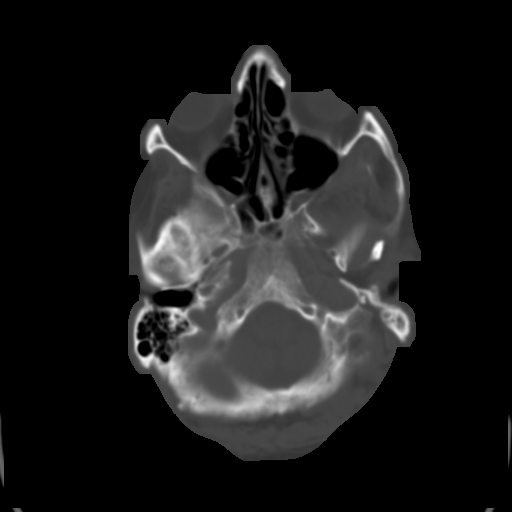
[im 9/33  brain]
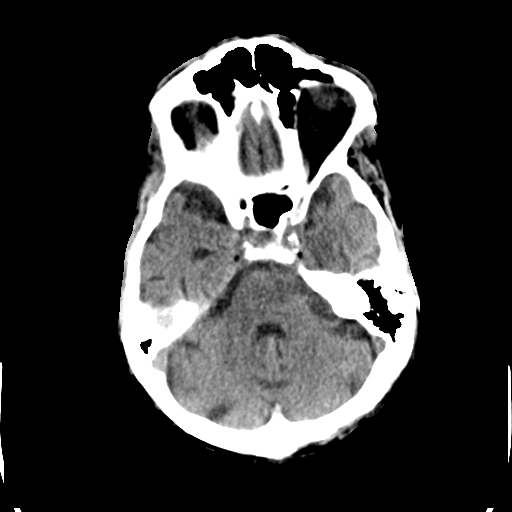
[im 13/33  brain]
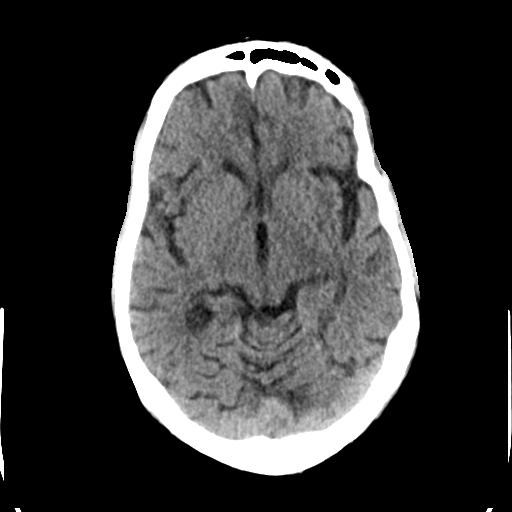
[im 17/33  brain]
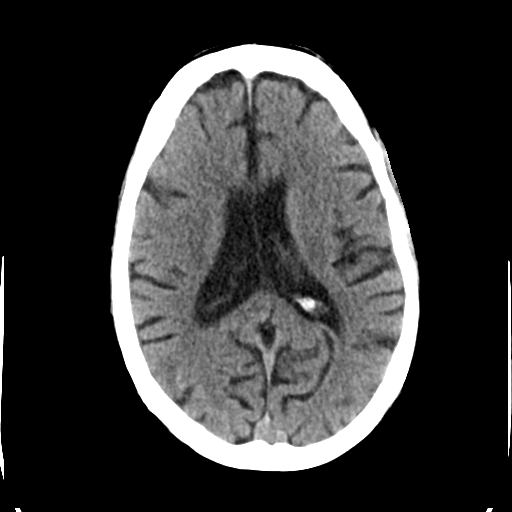
[im 21/33  brain]
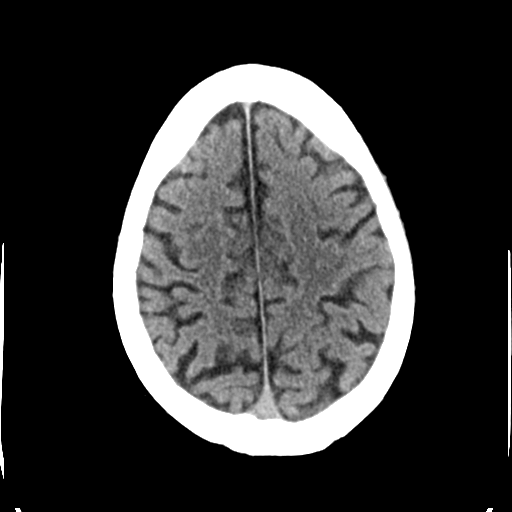
[im 21/33  bone]
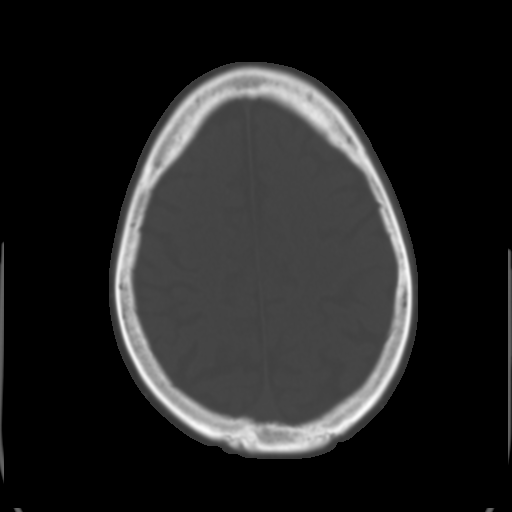
[im 25/33  brain]
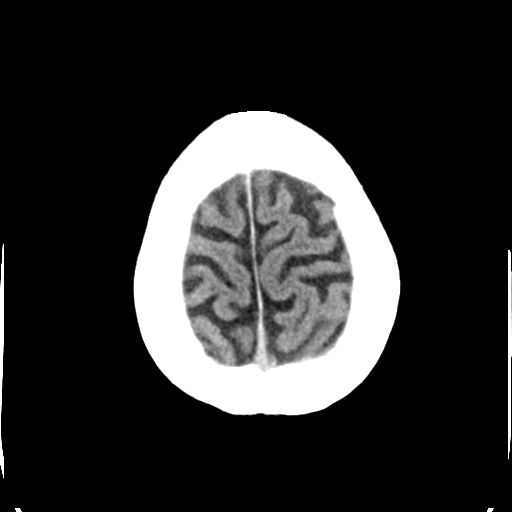
[im 29/33  brain]
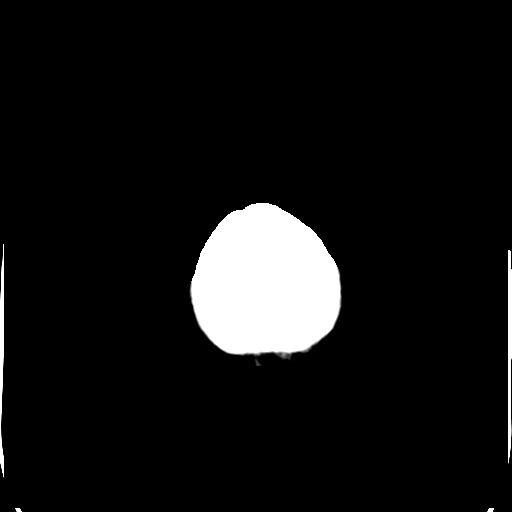

[Series 4: head bone · axial · 0.43mm/px · z∈[-117,-85]mm · 3 of 82 slices shown]
[im 9/82  bone]
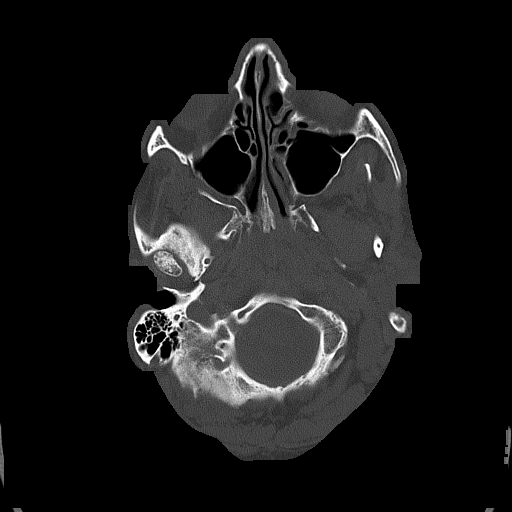
[im 17/82  bone]
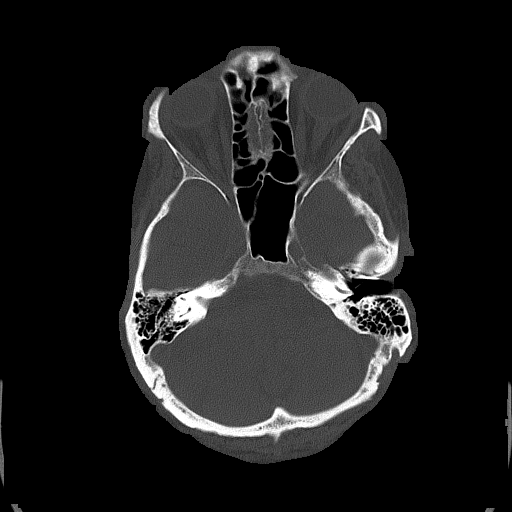
[im 25/82  bone]
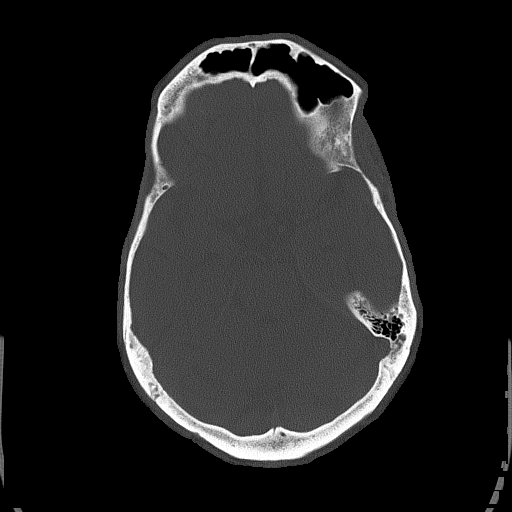

[Series 5: head without cor · coronal · non-contrast · 0.32mm/px · 3 of 72 slices shown]
[im 24/72  brain]
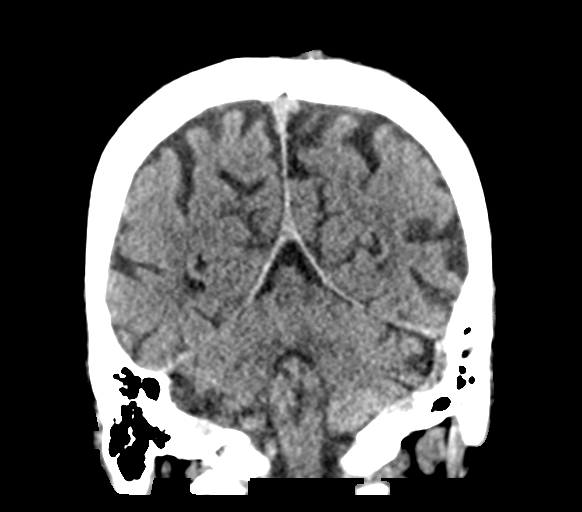
[im 32/72  brain]
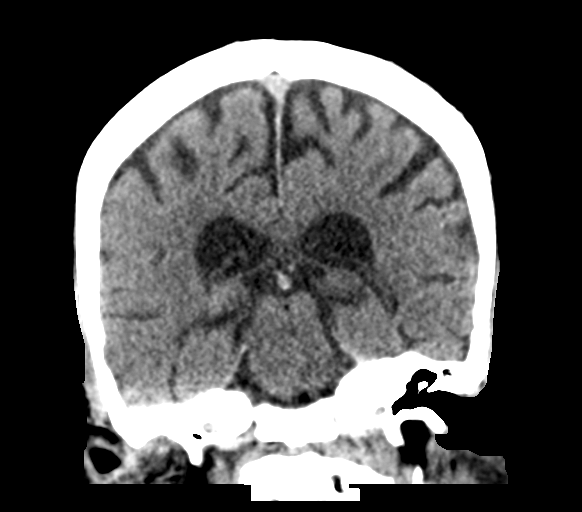
[im 40/72  brain]
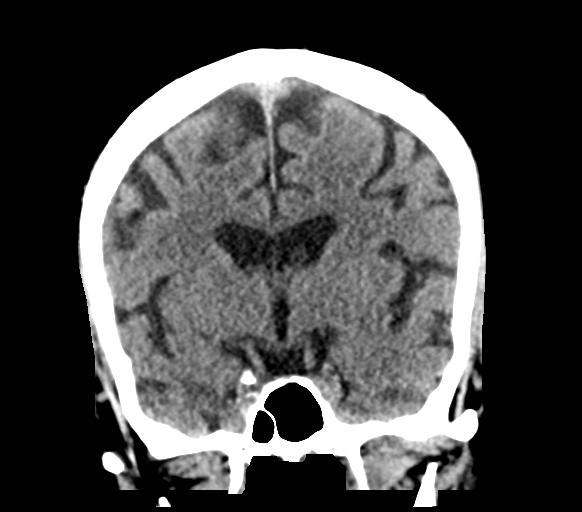

[Series 6: head without sag · sagittal · non-contrast · 0.32mm/px · 3 of 57 slices shown]
[im 19/57  brain]
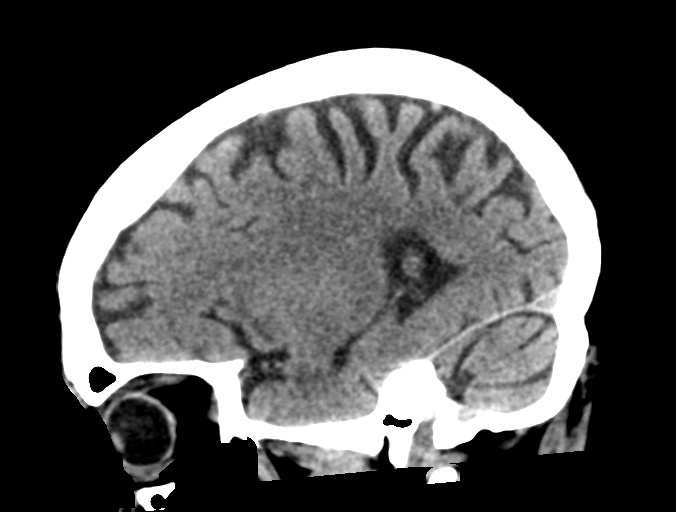
[im 29/57  brain]
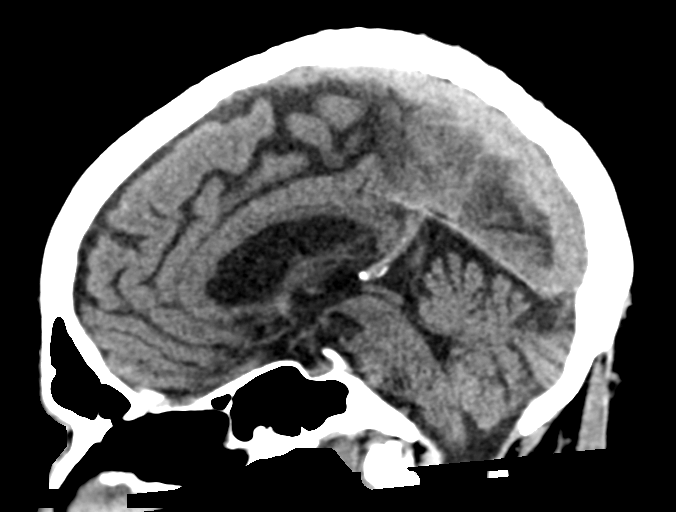
[im 38/57  brain]
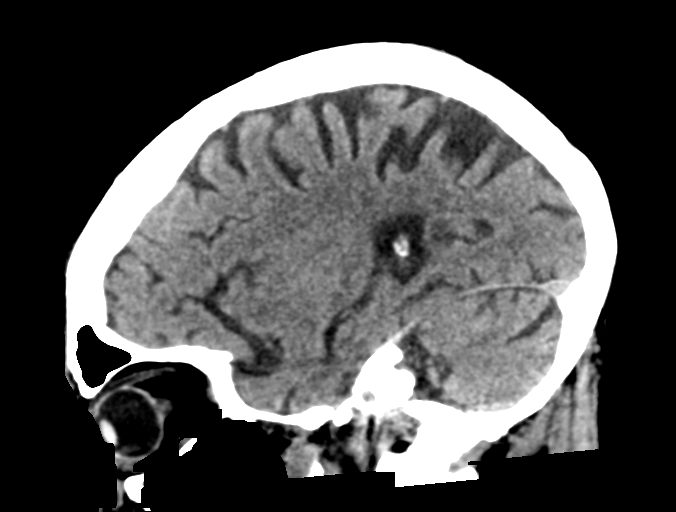

[16 of 47 positions shown; findings below may reference images not displayed]

FINDINGS: Brain: Brain atrophy, advanced for age. No sign of old or acute
focal infarction, mass lesion, hemorrhage, hydrocephalus or
extra-axial collection.

Vascular: There is atherosclerotic calcification of the major
vessels at the base of the brain.

Skull: Normal

Sinuses/Orbits: Clear/normal

Other: None
IMPRESSION: Brain atrophy, advanced for age. No acute or focal finding
otherwise. No specific cause of seizure identified.

## 2021-09-21 ENCOUNTER — Encounter: Payer: Self-pay | Admitting: Nurse Practitioner

## 2021-09-21 ENCOUNTER — Ambulatory Visit (INDEPENDENT_AMBULATORY_CARE_PROVIDER_SITE_OTHER): Payer: Self-pay | Admitting: Nurse Practitioner

## 2021-09-21 VITALS — BP 120/76 | HR 94 | Temp 97.5°F | Resp 12 | Wt 126.5 lb

## 2021-09-21 DIAGNOSIS — Z8739 Personal history of other diseases of the musculoskeletal system and connective tissue: Secondary | ICD-10-CM

## 2021-09-21 DIAGNOSIS — Z720 Tobacco use: Secondary | ICD-10-CM

## 2021-09-21 DIAGNOSIS — Z125 Encounter for screening for malignant neoplasm of prostate: Secondary | ICD-10-CM

## 2021-09-21 DIAGNOSIS — M2550 Pain in unspecified joint: Secondary | ICD-10-CM

## 2021-09-21 DIAGNOSIS — Z0000 Encounter for general adult medical examination without abnormal findings: Secondary | ICD-10-CM

## 2021-09-21 LAB — CBC
HCT: 42.5 % (ref 39.0–52.0)
Hemoglobin: 14.3 g/dL (ref 13.0–17.0)
MCHC: 33.6 g/dL (ref 30.0–36.0)
MCV: 96.9 fl (ref 78.0–100.0)
Platelets: 162 10*3/uL (ref 150.0–400.0)
RBC: 4.39 Mil/uL (ref 4.22–5.81)
RDW: 14.3 % (ref 11.5–15.5)
WBC: 6.9 10*3/uL (ref 4.0–10.5)

## 2021-09-21 LAB — LIPID PANEL
Cholesterol: 148 mg/dL (ref 0–200)
HDL: 90.3 mg/dL (ref >39.00)
LDL Cholesterol: 48 mg/dL (ref 0–99)
NonHDL: 58.11
Total CHOL/HDL Ratio: 2
Triglycerides: 50 mg/dL (ref 0.0–149.0)
VLDL: 10 mg/dL (ref 0.0–40.0)

## 2021-09-21 LAB — COMPREHENSIVE METABOLIC PANEL
ALT: 29 U/L (ref 0–53)
AST: 31 U/L (ref 0–37)
Albumin: 4.4 g/dL (ref 3.5–5.2)
Alkaline Phosphatase: 68 U/L (ref 39–117)
BUN: 7 mg/dL (ref 6–23)
CO2: 28 mEq/L (ref 19–32)
Calcium: 10.5 mg/dL (ref 8.4–10.5)
Chloride: 98 mEq/L (ref 96–112)
Creatinine, Ser: 0.71 mg/dL (ref 0.40–1.50)
GFR: 100.18 mL/min (ref >60.00)
Glucose, Bld: 133 mg/dL — ABNORMAL HIGH (ref 70–99)
Potassium: 4.7 mEq/L (ref 3.5–5.1)
Sodium: 135 mEq/L (ref 135–145)
Total Bilirubin: 0.8 mg/dL (ref 0.2–1.2)
Total Protein: 6.4 g/dL (ref 6.0–8.3)

## 2021-09-21 LAB — TSH: TSH: 2.62 u[IU]/mL (ref 0.35–5.50)

## 2021-09-21 LAB — PSA: PSA: 1.02 ng/mL (ref 0.10–4.00)

## 2021-09-21 NOTE — Assessment & Plan Note (Signed)
Current everyday tobacco user.  Usage waxes and wanes.  We will send ambulatory lung cancer screening referral to see if patient qualifies for low-dose CT scan

## 2021-09-21 NOTE — Assessment & Plan Note (Signed)
Patient's had multiple joint pains.  Has had several surgeries in the past inclusive of several back surgeries along with right ankle surgery does have left ankle pain bilateral knee pains has not been established with orthopedist.  He would like to see 1.  Ambulatory referral placed today.  Basic lab work pending inclusive of ANA and RF factor

## 2021-09-21 NOTE — Patient Instructions (Signed)
Nice to see you today I will be in touch with the lab results once I have them I want to see you in 1 year for your next physical, sooner if you need

## 2021-09-21 NOTE — Assessment & Plan Note (Signed)
Patient states he has a history of rheumatoid arthritis along with his father having rheumatoid arthritis.  States he has had treatment with prednisone in the past but does not think he has been established with a rheumatologist.  Pending lab results today

## 2021-09-21 NOTE — Assessment & Plan Note (Signed)
Discussed age-appropriate immunizations and screening exams.  Patient deferred colonoscopy as he does not want to have that done.  He does seem he might be interested in Cologuard unsure of insurance coverage.  Did offer patient shingles vaccine he did not want at the current time information given at discharge
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
## 2021-09-23 ENCOUNTER — Other Ambulatory Visit (INDEPENDENT_AMBULATORY_CARE_PROVIDER_SITE_OTHER): Payer: Self-pay

## 2021-09-23 DIAGNOSIS — R739 Hyperglycemia, unspecified: Secondary | ICD-10-CM

## 2021-09-23 LAB — HEMOGLOBIN A1C: Hgb A1c MFr Bld: 5.4 % (ref 4.6–6.5)

## 2021-09-24 LAB — ANA: Anti Nuclear Antibody (ANA): NEGATIVE

## 2022-11-06 ENCOUNTER — Observation Stay (HOSPITAL_COMMUNITY): Payer: Medicaid Other

## 2022-11-06 ENCOUNTER — Emergency Department (HOSPITAL_COMMUNITY): Payer: Medicaid Other

## 2022-11-06 ENCOUNTER — Encounter (HOSPITAL_COMMUNITY): Payer: Self-pay

## 2022-11-06 ENCOUNTER — Other Ambulatory Visit: Payer: Self-pay

## 2022-11-06 ENCOUNTER — Inpatient Hospital Stay (HOSPITAL_COMMUNITY)
Admission: EM | Admit: 2022-11-06 | Discharge: 2022-11-08 | DRG: 101 | Disposition: A | Discharge status: Home / Self Care | Payer: Medicaid Other | Attending: Internal Medicine | Admitting: Internal Medicine

## 2022-11-06 DIAGNOSIS — R569 Unspecified convulsions: Principal | ICD-10-CM | POA: Diagnosis present

## 2022-11-06 DIAGNOSIS — Z8249 Family history of ischemic heart disease and other diseases of the circulatory system: Secondary | ICD-10-CM

## 2022-11-06 DIAGNOSIS — Z888 Allergy status to other drugs, medicaments and biological substances status: Secondary | ICD-10-CM

## 2022-11-06 DIAGNOSIS — F10939 Alcohol use, unspecified with withdrawal, unspecified: Secondary | ICD-10-CM | POA: Diagnosis present

## 2022-11-06 DIAGNOSIS — Z720 Tobacco use: Secondary | ICD-10-CM | POA: Diagnosis present

## 2022-11-06 DIAGNOSIS — R2990 Unspecified symptoms and signs involving the nervous system: Secondary | ICD-10-CM | POA: Diagnosis present

## 2022-11-06 DIAGNOSIS — Z833 Family history of diabetes mellitus: Secondary | ICD-10-CM

## 2022-11-06 DIAGNOSIS — Z820 Family history of epilepsy and other diseases of the nervous system: Secondary | ICD-10-CM

## 2022-11-06 DIAGNOSIS — E872 Acidosis, unspecified: Secondary | ICD-10-CM | POA: Diagnosis not present

## 2022-11-06 DIAGNOSIS — Y901 Blood alcohol level of 20-39 mg/100 ml: Secondary | ICD-10-CM | POA: Diagnosis present

## 2022-11-06 DIAGNOSIS — R531 Weakness: Secondary | ICD-10-CM | POA: Diagnosis present

## 2022-11-06 DIAGNOSIS — F17210 Nicotine dependence, cigarettes, uncomplicated: Secondary | ICD-10-CM | POA: Diagnosis present

## 2022-11-06 DIAGNOSIS — Z8261 Family history of arthritis: Secondary | ICD-10-CM

## 2022-11-06 DIAGNOSIS — Z8739 Personal history of other diseases of the musculoskeletal system and connective tissue: Secondary | ICD-10-CM

## 2022-11-06 DIAGNOSIS — R4701 Aphasia: Secondary | ICD-10-CM | POA: Diagnosis present

## 2022-11-06 DIAGNOSIS — E860 Dehydration: Secondary | ICD-10-CM | POA: Diagnosis present

## 2022-11-06 DIAGNOSIS — M419 Scoliosis, unspecified: Secondary | ICD-10-CM | POA: Diagnosis present

## 2022-11-06 DIAGNOSIS — M069 Rheumatoid arthritis, unspecified: Secondary | ICD-10-CM | POA: Diagnosis present

## 2022-11-06 DIAGNOSIS — Z801 Family history of malignant neoplasm of trachea, bronchus and lung: Secondary | ICD-10-CM

## 2022-11-06 DIAGNOSIS — F10239 Alcohol dependence with withdrawal, unspecified: Secondary | ICD-10-CM | POA: Diagnosis present

## 2022-11-06 DIAGNOSIS — F10931 Alcohol use, unspecified with withdrawal delirium: Secondary | ICD-10-CM | POA: Diagnosis not present

## 2022-11-06 LAB — BASIC METABOLIC PANEL
Anion gap: 11 (ref 5–15)
BUN: <5 mg/dL — ABNORMAL LOW (ref 6–20)
CO2: 24 mmol/L (ref 22–32)
Calcium: 9.4 mg/dL (ref 8.9–10.3)
Chloride: 100 mmol/L (ref 98–111)
Creatinine, Ser: 0.8 mg/dL (ref 0.61–1.24)
GFR, Estimated: >60 mL/min (ref >60)
Glucose, Bld: 101 mg/dL — ABNORMAL HIGH (ref 70–99)
Potassium: 4.2 mmol/L (ref 3.5–5.1)
Sodium: 135 mmol/L (ref 135–145)

## 2022-11-06 LAB — I-STAT CHEM 8, ED
BUN: 4 mg/dL — ABNORMAL LOW (ref 6–20)
Calcium, Ion: 1.2 mmol/L (ref 1.15–1.40)
Chloride: 102 mmol/L (ref 98–111)
Creatinine, Ser: 0.8 mg/dL (ref 0.61–1.24)
Glucose, Bld: 105 mg/dL — ABNORMAL HIGH (ref 70–99)
HCT: 50 % (ref 39.0–52.0)
Hemoglobin: 17 g/dL (ref 13.0–17.0)
Potassium: 3.8 mmol/L (ref 3.5–5.1)
Sodium: 134 mmol/L — ABNORMAL LOW (ref 135–145)
TCO2: 11 mmol/L — ABNORMAL LOW (ref 22–32)

## 2022-11-06 LAB — DIFFERENTIAL
Abs Immature Granulocytes: 0.03 10*3/uL (ref 0.00–0.07)
Basophils Absolute: 0.1 10*3/uL (ref 0.0–0.1)
Basophils Relative: 1 %
Eosinophils Absolute: 0 10*3/uL (ref 0.0–0.5)
Eosinophils Relative: 0 %
Immature Granulocytes: 0 %
Lymphocytes Relative: 21 %
Lymphs Abs: 2.5 10*3/uL (ref 0.7–4.0)
Monocytes Absolute: 1.1 10*3/uL — ABNORMAL HIGH (ref 0.1–1.0)
Monocytes Relative: 9 %
Neutro Abs: 8.3 10*3/uL — ABNORMAL HIGH (ref 1.7–7.7)
Neutrophils Relative %: 69 %

## 2022-11-06 LAB — I-STAT VENOUS BLOOD GAS, ED
Acid-base deficit: 3 mmol/L — ABNORMAL HIGH (ref 0.0–2.0)
Bicarbonate: 23.1 mmol/L (ref 20.0–28.0)
Calcium, Ion: 1.27 mmol/L (ref 1.15–1.40)
HCT: 46 % (ref 39.0–52.0)
Hemoglobin: 15.6 g/dL (ref 13.0–17.0)
O2 Saturation: 69 %
Potassium: 4.2 mmol/L (ref 3.5–5.1)
Sodium: 136 mmol/L (ref 135–145)
TCO2: 25 mmol/L (ref 22–32)
pCO2, Ven: 45.9 mmHg (ref 44–60)
pH, Ven: 7.311 (ref 7.25–7.43)
pO2, Ven: 39 mmHg (ref 32–45)

## 2022-11-06 LAB — COMPREHENSIVE METABOLIC PANEL
ALT: 63 U/L — ABNORMAL HIGH (ref 0–44)
AST: 63 U/L — ABNORMAL HIGH (ref 15–41)
Albumin: 4.4 g/dL (ref 3.5–5.0)
Alkaline Phosphatase: 73 U/L (ref 38–126)
Anion gap: 27 — ABNORMAL HIGH (ref 5–15)
BUN: 5 mg/dL — ABNORMAL LOW (ref 6–20)
CO2: 9 mmol/L — ABNORMAL LOW (ref 22–32)
Calcium: 9.7 mg/dL (ref 8.9–10.3)
Chloride: 98 mmol/L (ref 98–111)
Creatinine, Ser: 0.96 mg/dL (ref 0.61–1.24)
GFR, Estimated: >60 mL/min (ref >60)
Glucose, Bld: 108 mg/dL — ABNORMAL HIGH (ref 70–99)
Potassium: 3.8 mmol/L (ref 3.5–5.1)
Sodium: 134 mmol/L — ABNORMAL LOW (ref 135–145)
Total Bilirubin: 0.4 mg/dL (ref 0.3–1.2)
Total Protein: 6.6 g/dL (ref 6.5–8.1)

## 2022-11-06 LAB — LACTIC ACID, PLASMA: Lactic Acid, Venous: 1.9 mmol/L (ref 0.5–1.9)

## 2022-11-06 LAB — ETHANOL: Alcohol, Ethyl (B): 15 mg/dL — ABNORMAL HIGH (ref <10)

## 2022-11-06 LAB — CBC
HCT: 46.1 % (ref 39.0–52.0)
Hemoglobin: 15.5 g/dL (ref 13.0–17.0)
MCH: 32 pg (ref 26.0–34.0)
MCHC: 33.6 g/dL (ref 30.0–36.0)
MCV: 95.1 fL (ref 80.0–100.0)
Platelets: 172 10*3/uL (ref 150–400)
RBC: 4.85 MIL/uL (ref 4.22–5.81)
RDW: 12.8 % (ref 11.5–15.5)
WBC: 12 10*3/uL — ABNORMAL HIGH (ref 4.0–10.5)
nRBC: 0 % (ref 0.0–0.2)

## 2022-11-06 LAB — SALICYLATE LEVEL: Salicylate Lvl: <7 mg/dL — ABNORMAL LOW (ref 7.0–30.0)

## 2022-11-06 LAB — CBG MONITORING, ED: Glucose-Capillary: 97 mg/dL (ref 70–99)

## 2022-11-06 LAB — PROTIME-INR
INR: 1 (ref 0.8–1.2)
Prothrombin Time: 13.6 seconds (ref 11.4–15.2)

## 2022-11-06 LAB — PROCALCITONIN: Procalcitonin: <0.1 ng/mL

## 2022-11-06 LAB — AMMONIA: Ammonia: 25 umol/L (ref 9–35)

## 2022-11-06 LAB — APTT: aPTT: 27 seconds (ref 24–36)

## 2022-11-06 LAB — BETA-HYDROXYBUTYRIC ACID: Beta-Hydroxybutyric Acid: 1.67 mmol/L — ABNORMAL HIGH (ref 0.05–0.27)

## 2022-11-06 LAB — LIPASE, BLOOD: Lipase: 88 U/L — ABNORMAL HIGH (ref 11–51)

## 2022-11-06 LAB — TSH: TSH: 2.223 u[IU]/mL (ref 0.350–4.500)

## 2022-11-06 LAB — PHOSPHORUS: Phosphorus: 3.3 mg/dL (ref 2.5–4.6)

## 2022-11-06 LAB — OSMOLALITY: Osmolality: 284 mOsm/kg (ref 275–295)

## 2022-11-06 LAB — MAGNESIUM: Magnesium: 1.8 mg/dL (ref 1.7–2.4)

## 2022-11-06 LAB — CK: Total CK: 117 U/L (ref 49–397)

## 2022-11-06 MED ORDER — THIAMINE HCL 100 MG/ML IJ SOLN
500.0000 mg | Freq: Once | INTRAVENOUS | Status: AC
  Administered 2022-11-06: 500 mg via INTRAVENOUS
  Filled 2022-11-06: qty 5

## 2022-11-06 MED ORDER — SODIUM CHLORIDE 0.9% FLUSH
3.0000 mL | Freq: Once | INTRAVENOUS | Status: AC
  Administered 2022-11-06: 3 mL via INTRAVENOUS

## 2022-11-06 MED ORDER — LORAZEPAM 1 MG PO TABS: 1.0000 mg | ORAL_TABLET | ORAL | Status: DC | PRN

## 2022-11-06 MED ORDER — THIAMINE HCL 100 MG/ML IJ SOLN
100.0000 mg | Freq: Every day | INTRAMUSCULAR | Status: DC
  Administered 2022-11-06: 100 mg via INTRAVENOUS
  Filled 2022-11-06: qty 2

## 2022-11-06 MED ORDER — ORAL CARE MOUTH RINSE: 15.0000 mL | OROMUCOSAL | Status: DC | PRN

## 2022-11-06 MED ORDER — LORAZEPAM 2 MG/ML IJ SOLN
1.0000 mg | INTRAMUSCULAR | Status: DC | PRN
  Administered 2022-11-06: 2 mg via INTRAVENOUS
  Filled 2022-11-06: qty 1

## 2022-11-06 MED ORDER — LEVETIRACETAM IN NACL 1000 MG/100ML IV SOLN
1000.0000 mg | Freq: Once | INTRAVENOUS | Status: AC
  Administered 2022-11-06: 1000 mg via INTRAVENOUS

## 2022-11-06 MED ORDER — ADULT MULTIVITAMIN W/MINERALS CH
1.0000 | ORAL_TABLET | Freq: Every day | ORAL | Status: DC
  Administered 2022-11-06 – 2022-11-08 (×3): 1 via ORAL
  Filled 2022-11-06 (×3): qty 1

## 2022-11-06 MED ORDER — FOLIC ACID 1 MG PO TABS
1.0000 mg | ORAL_TABLET | Freq: Every day | ORAL | Status: DC
  Administered 2022-11-06 – 2022-11-08 (×3): 1 mg via ORAL
  Filled 2022-11-06 (×3): qty 1

## 2022-11-06 MED ORDER — THIAMINE MONONITRATE 100 MG PO TABS
100.0000 mg | ORAL_TABLET | Freq: Every day | ORAL | Status: DC
  Administered 2022-11-07 – 2022-11-08 (×2): 100 mg via ORAL
  Filled 2022-11-06 (×2): qty 1

## 2022-11-06 MED ORDER — NICOTINE 21 MG/24HR TD PT24
21.0000 mg | MEDICATED_PATCH | Freq: Every day | TRANSDERMAL | Status: DC
  Administered 2022-11-06 – 2022-11-08 (×3): 21 mg via TRANSDERMAL
  Filled 2022-11-06 (×3): qty 1

## 2022-11-06 MED ORDER — IOHEXOL 350 MG/ML SOLN
75.0000 mL | Freq: Once | INTRAVENOUS | Status: AC | PRN
  Administered 2022-11-06: 75 mL via INTRAVENOUS
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
## 2022-11-06 NOTE — ED Triage Notes (Signed)
Per [facility] pt coming from home as code stroke with last known normal of 1400. Reports patient having right sided weakness and aphasia. Ems report witnessed grand mal seizure and given 5mg  versed. Patient daughter states patient has had a lot of stress recently, smokes 2 packs of cigarettes daily and drinks 24-36 beers daily.

## 2022-11-06 NOTE — Code Documentation (Signed)
Stroke Response Nurse Documentation Code Documentation  Larry Lozano is a 62 y.o. male arriving to [name]  via [name] on 11-06-2022 with past medical hx of ETOH, Smoker, Rheumatoid Arthritis. On No antithrombotic. Code stroke was activated by EMS.   Patient from home where he was LKW at 1400 and now complaining of Right side weakness, difficulty speaking, seizure activity  Stroke team at the bedside on patient arrival. Labs drawn and patient cleared for CT by Dr. Audrie Lia. Patient to CT with team. NIHSS 7, see documentation for details and code stroke times. Patient with decreased LOC, disoriented, not following commands, and Global aphasia  on exam. The following imaging was completed:  CT Head and CTA. Patient is not a candidate for IV Thrombolytic due to rapidly improving symptoms. Patient is not a candidate for IR due to no LVO on CTA.   Care Plan: Cancel Code Stroke, Assessment per ED, [facility] swallow screen prior or oral intake  Bedside handoff with ED RN Florentina Addison.    Marcellina Millin  Stroke Response RN
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
## 2022-11-06 NOTE — ED Notes (Signed)
RT responded to Trauma A for code stroke being bagged. Upon arrival pt with spontaneous respirations of 22, SpO2 on RA 100% and no acute distress noted. Pt to CT and RT released by RN at that time. RT will continue to be available as needed.

## 2022-11-06 NOTE — Assessment & Plan Note (Signed)
Suspect alcoholic ketoacidosis will check beta hydroxybutyrate acid, check UA for ketones,  Check VBG and lactic acid given recent seizure activity lactic acid likely will be elevated for completion we will obtain methanol and offending glycol level Obtain serial B meds and monitor closely in progressive

## 2022-11-06 NOTE — Consult Note (Signed)
Neurology Consultation  Reason for Consult: Right-sided weakness and aphasia Referring Physician: Dr. Posey Rea  CC: None  History is obtained from: EMS, family and chart  HPI: Jewett Mcgann Tully is a 62 y.o. male with history of alcoholism and alcohol withdrawal seizures as well as stroke per family who presents with sudden onset right-sided weakness and aphasia.  Patient's family called EMS, and he had a generalized tonic-clonic seizure while in the ambulance.  He was given 5 mg of IM Versed for this.  On arrival to the emergency department, he was postictal, but with repeated stimulation he became able to localize noxious stimulation with bilateral upper extremities.  Patient's family states that he has been going through a lot of stress recently and that he smokes 2 packs/day and drinks 24-36 beers daily.  They stated that he has not recently tried to cut down on alcohol use, although he has been through rehabilitation 3 times.  While in the ED, patient became able to speak although with significant dysarthria.  He was able to state his name but was disoriented to time place and situation.   LKW: 1400 TNK given?: no, symptoms likely due to seizure, not stroke IR Thrombectomy? No, no LVO Modified Rankin Scale: 1-No significant post stroke disability and can perform usual duties with stroke symptoms  ROS: Unable to obtain due to altered mental status.   Past Medical History:  Diagnosis Date   Alcohol abuse    Alcohol withdrawal seizure (HCC)    Rheumatoid arthritis (HCC)    severe     Family History  Problem Relation Age of Onset   Diabetes Father    Dementia Father    Rheum arthritis Father    Heart disease Sister        had 2 open heart surgeries   Alzheimer's disease Maternal Grandmother    Lung cancer Maternal Grandfather      Social History:   reports that he has been smoking cigarettes. He has a 26.3 pack-year smoking history. He has never used smokeless tobacco. He reports  current alcohol use. He reports that he does not use drugs.  Medications  Current Facility-Administered Medications:    levETIRAcetam (KEPPRA) IVPB 1000 mg/100 mL premix, 1,000 mg, Intravenous, Once, Last Rate: 400 mL/hr at 11/06/22 1703, 1,000 mg at 11/06/22 1703 **FOLLOWED BY** levETIRAcetam (KEPPRA) IVPB 1000 mg/100 mL premix, 1,000 mg, Intravenous, Once **FOLLOWED BY** [COMPLETED] levETIRAcetam (KEPPRA) IVPB 1000 mg/100 mL premix, 1,000 mg, Intravenous, Once, Kommor, [place], MD, Last Rate: 400 mL/hr at 11/06/22 1643, 1,000 mg at 11/06/22 1643 No current outpatient medications on file.   Exam: Current vital signs: BP (!) 128/51   Pulse (!) 118   Resp (!) 24   Ht 5\' 9"  (1.753 m)   Wt 61 kg   SpO2 97%   BMI 19.86 kg/m  Vital signs in last 24 hours: Pulse Rate:  [118-142] 118 (07/21 1702) Resp:  [24] 24 (07/21 1645) BP: (128-144)/(51-93) 128/51 (07/21 1702) SpO2:  [97 %-98 %] 97 % (07/21 1702) Weight:  [61 kg] 61 kg (07/21 1704)  GENERAL: Awake, alert, in no acute distress, thin appearing Head: Normocephalic and atraumatic, without obvious abnormality EENT: Normal conjunctivae, dry mucous membranes, no OP obstruction LUNGS: Normal respiratory effort. Non-labored breathing on room air CV: Regular rate and rhythm on telemetry Extremities: warm, well perfused, without obvious deformity  NEURO:  Mental Status: Awake, alert and interactive, able to state name but disoriented to time place and situation.  Intermittently follows simple  commands but cannot follow two-step commands. He is not able to provide a clear and coherent history of present illness. Speech/Language: speech is very dysarthric.   No neglect is noted Cranial Nerves:  II: PERRL III, IV, VI: EOMI. Lid elevation symmetric and full.  V: Blinks to threat bilaterally VII: Face is symmetric resting  VIII: Hearing intact to voice IX, X: Voice is dysarthric XII: Uncooperative with tongue protrusion Motor: Bilateral  upper extremities purposefully with good antigravity strength, able to move bilateral lower extremities with antigravity strength Tone is normal. Bulk is normal.  Sensation: Intact to light touch bilaterally in all four extremities.  Coordination: Unable to perform Gait: Deferred  NIHSS: 1a Level of Conscious.: 1 1b LOC Questions: 2 1c LOC Commands 2  2 Best Gaze: 0 3 Visual: 0 4 Facial Palsy: 0 5a Motor Arm - left: 0 5b Motor Arm - Right: 0 6a Motor Leg - Left: 0 6b Motor Leg - Right: 0 7 Limb Ataxia: 0 8 Sensory: 0 9 Best Language: 0 10 Dysarthria: 2 11 Extinct. and Inatten.: 0 TOTAL: 7   Labs I have reviewed labs in epic and the results pertinent to this consultation are:  CBC    Component Value Date/Time   WBC 12.0 (H) 11/06/2022 1636   RBC 4.85 11/06/2022 1636   HGB 17.0 11/06/2022 1641   HCT 50.0 11/06/2022 1641   PLT 172 11/06/2022 1636   MCV 95.1 11/06/2022 1636   MCH 32.0 11/06/2022 1636   MCHC 33.6 11/06/2022 1636   RDW 12.8 11/06/2022 1636   LYMPHSABS 2.5 11/06/2022 1636   MONOABS 1.1 (H) 11/06/2022 1636   EOSABS 0.0 11/06/2022 1636   BASOSABS 0.1 11/06/2022 1636    CMP     Component Value Date/Time   NA 134 (L) 11/06/2022 1641   K 3.8 11/06/2022 1641   CL 102 11/06/2022 1641   CO2 28 09/21/2021 1237   GLUCOSE 105 (H) 11/06/2022 1641   BUN 4 (L) 11/06/2022 1641   CREATININE 0.80 11/06/2022 1641   CALCIUM 10.5 09/21/2021 1237   PROT 6.4 09/21/2021 1237   ALBUMIN 4.4 09/21/2021 1237   AST 31 09/21/2021 1237   ALT 29 09/21/2021 1237   ALKPHOS 68 09/21/2021 1237   BILITOT 0.8 09/21/2021 1237   GFRNONAA >60 12/03/2016 0457   GFRAA >60 12/03/2016 0457    Lipid Panel     Component Value Date/Time   CHOL 148 09/21/2021 1237   TRIG 50.0 09/21/2021 1237   HDL 90.30 09/21/2021 1237   CHOLHDL 2 09/21/2021 1237   VLDL 10.0 09/21/2021 1237   LDLCALC 48 09/21/2021 1237   Alcohol 15  Imaging I have reviewed the images obtained:  CT-scan of  the brain: No acute abnormality  CTA head and neck: No LVO or hemodynamically significant stenosis  MRI examination of the brain: Pending  Assessment: 62 year old patient with history of alcoholism and alcohol withdrawal seizures presents with sudden onset right-sided weakness and aphasia.  He then had a generalized tonic-clonic seizure while in the ambulance on his way to the hospital.  He was noted to be postictal in the emergency department but gradually became more responsive, although he remains disoriented to place, time and situation.  He was loaded with 3 g of Keppra.  CT head was negative for acute abnormality, and CT angiogram shows no LVO.  Right-sided weakness could have been Todd's paralysis after an unwitnessed seizure, and aphasia could have been due to postictal state, but this is uncertain.  He will need MRI brain for evaluation, as well as resting EEG.  Alcohol level was 15 on arrival to the ED, would expect this to be higher given patient's reported consumption.  Per family, he drinks 24-36 beers daily.  Patient has been appropriately placed on CIWA protocol.    Impression: Alcohol withdrawal seizure with possible Todd's paralysis and postictal state  Recommendations: -Routine EEG -Brain MRI -Treatment of alcohol withdrawal per primary team -No need to initiate AEDs as of yet, as seizure was most likely due to EtOH withdrawal  Pt seen by NP/Neuro and later by MD. Note/plan to be edited by MD as needed.  Cortney E Ernestina Columbia , MSN, AGACNP-BC [facility] See Amion for schedule and pager information 11/06/2022 5:14 PM   NEUROHOSPITALIST ADDENDUM Performed a face to face diagnostic evaluation.   I have reviewed the contents of history and physical exam as documented by PA/ARNP/Resident and agree with above documentation.  I have discussed and formulated the above plan as documented. Edits to the note have been made as needed.  Impression/Key exam findings/Plan:  brought in with confusion, aphasia and ? R sided weakness. Had a GTC seizure enroute. Post ictal on arrival. Woke up more with provocative maneuvers and following commands.  Per family stressed as he is undergoing divorce, just lost his house and evicted today. Drinks 24-30 12Oz beers a day. Been dehydrated due to not drinking much over the last couple days.  Will get MRI Brain and routine EEG for further evaluation. EtOH levels are at 15. Given how much he drinks, I suspect that this is likely a withdrawal. Would have expected much higher alcohol levels otherwise. At this time, will hold off on AEDs.  No driving for 6 months. Has to be seizure free before he can resume driving.  Erick Blinks, MD [facility] [phone]   If 7pm to 7am, please call on call as listed on AMION.

## 2022-11-06 NOTE — Subjective & Objective (Signed)
Pt presented with Right side weakness and aphasia Hx of heavy alcohol abuse EMS witnessed grand mal seizure and gave 5 mg of Versed and Per family patient drinks about 24-46 beers daily By the time patient arrived to emergency department symptoms started to improve not a candidate for IV thrombolytics CTA done showing no LVO Code stroke canceled and patient being admitted for alcohol withdrawal seizure

## 2022-11-06 NOTE — ED Notes (Signed)
Patient daughter and sister at bedside.

## 2022-11-06 NOTE — Assessment & Plan Note (Signed)
-   Spoke about importance of quitting spent 5 minutes discussing options for treatment, prior attempts at quitting, and dangers of smoking ?  ? - order nicotine patch  ? - nursing tobacco cessation protocol ? ?

## 2022-11-06 NOTE — Plan of Care (Signed)
  Problem: Education: Goal: Knowledge of General Education information will improve Description Including pain rating scale, medication(s)/side effects and non-pharmacologic comfort measures Outcome: Progressing   

## 2022-11-06 NOTE — Progress Notes (Signed)
Pt's sister, Osaze Hubbert, updated about pt's condition over the phone with pt's permission.

## 2022-11-06 NOTE — Procedures (Signed)
Routine EEG Report  Larry Lozano is a 62 y.o. male with a history of seizure who is undergoing an EEG to evaluate for seizures.  Report: This EEG was acquired with electrodes placed according to the International 10-20 electrode system (including Fp1, Fp2, F3, F4, C3, C4, P3, P4, O1, O2, T3, T4, T5, T6, A1, A2, Fz, Cz, Pz). The following electrodes were missing or displaced: none.  The best background was 9 Hz, symmetric, and reactive to stimulation. There was no clear waking rhythm. Deeper stages of sleep were identified by sleep spindles. There are overriding beta frequencies. There was no focal slowing. There were no interictal epileptiform discharges. There were no electrographic seizures identified. Photic stimulation and hyperventilation were not performed.  Impression: This EEG was obtained while asleep and is normal.    Clinical Correlation: Normal EEGs, however, do not rule out epilepsy.  Bing Neighbors, MD [facility] [phone]  If 7pm- 7am, please page neurology on call as listed in AMION.

## 2022-11-06 NOTE — Assessment & Plan Note (Signed)
Not on any home medications continue to monitor

## 2022-11-06 NOTE — Assessment & Plan Note (Signed)
Suspect Larry Lozano's paralysis in the setting of recent seizure now resolved await results of MRI appreciate neurology consult

## 2022-11-06 NOTE — ED Notes (Signed)
EEG at bedside.
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
## 2022-11-06 NOTE — Progress Notes (Signed)
EEG complete - results pending 

## 2022-11-06 NOTE — ED Notes (Signed)
Patient transported to MRI 

## 2022-11-06 NOTE — Assessment & Plan Note (Signed)
CIWA protocol in place.

## 2022-11-07 DIAGNOSIS — R531 Weakness: Secondary | ICD-10-CM | POA: Diagnosis present

## 2022-11-07 DIAGNOSIS — R2990 Unspecified symptoms and signs involving the nervous system: Secondary | ICD-10-CM | POA: Diagnosis not present

## 2022-11-07 DIAGNOSIS — Z8261 Family history of arthritis: Secondary | ICD-10-CM | POA: Diagnosis not present

## 2022-11-07 DIAGNOSIS — F17210 Nicotine dependence, cigarettes, uncomplicated: Secondary | ICD-10-CM | POA: Diagnosis present

## 2022-11-07 DIAGNOSIS — Z833 Family history of diabetes mellitus: Secondary | ICD-10-CM | POA: Diagnosis not present

## 2022-11-07 DIAGNOSIS — F10239 Alcohol dependence with withdrawal, unspecified: Secondary | ICD-10-CM | POA: Diagnosis present

## 2022-11-07 DIAGNOSIS — M419 Scoliosis, unspecified: Secondary | ICD-10-CM | POA: Diagnosis present

## 2022-11-07 DIAGNOSIS — Z801 Family history of malignant neoplasm of trachea, bronchus and lung: Secondary | ICD-10-CM | POA: Diagnosis not present

## 2022-11-07 DIAGNOSIS — F10931 Alcohol use, unspecified with withdrawal delirium: Secondary | ICD-10-CM | POA: Diagnosis not present

## 2022-11-07 DIAGNOSIS — E860 Dehydration: Secondary | ICD-10-CM | POA: Diagnosis present

## 2022-11-07 DIAGNOSIS — E872 Acidosis, unspecified: Secondary | ICD-10-CM | POA: Diagnosis present

## 2022-11-07 DIAGNOSIS — Y901 Blood alcohol level of 20-39 mg/100 ml: Secondary | ICD-10-CM | POA: Diagnosis present

## 2022-11-07 DIAGNOSIS — R569 Unspecified convulsions: Secondary | ICD-10-CM | POA: Diagnosis present

## 2022-11-07 DIAGNOSIS — R4701 Aphasia: Secondary | ICD-10-CM | POA: Diagnosis present

## 2022-11-07 DIAGNOSIS — F10939 Alcohol use, unspecified with withdrawal, unspecified: Secondary | ICD-10-CM | POA: Diagnosis present

## 2022-11-07 DIAGNOSIS — M069 Rheumatoid arthritis, unspecified: Secondary | ICD-10-CM | POA: Diagnosis present

## 2022-11-07 DIAGNOSIS — Z8249 Family history of ischemic heart disease and other diseases of the circulatory system: Secondary | ICD-10-CM | POA: Diagnosis not present

## 2022-11-07 DIAGNOSIS — Z888 Allergy status to other drugs, medicaments and biological substances status: Secondary | ICD-10-CM | POA: Diagnosis not present

## 2022-11-07 DIAGNOSIS — Z820 Family history of epilepsy and other diseases of the nervous system: Secondary | ICD-10-CM | POA: Diagnosis not present

## 2022-11-07 LAB — RAPID URINE DRUG SCREEN, HOSP PERFORMED
Amphetamines: NOT DETECTED
Barbiturates: NOT DETECTED
Benzodiazepines: POSITIVE — AB
Cocaine: NOT DETECTED
Opiates: NOT DETECTED
Tetrahydrocannabinol: NOT DETECTED

## 2022-11-07 LAB — VOLATILES,BLD-ACETONE,ETHANOL,ISOPROP,METHANOL
Acetone, blood: <0.01 g/dL (ref 0.000–0.010)
Ethanol, blood: <0.01 g/dL (ref 0.000–0.010)
Isopropanol, blood: <0.01 g/dL (ref 0.000–0.010)
Methanol, blood: <0.01 g/dL (ref 0.000–0.010)

## 2022-11-07 LAB — ETHYLENE GLYCOL: Ethylene Glycol Lvl: <5 mg/dL

## 2022-11-07 LAB — OSMOLALITY, URINE: Osmolality, Ur: 327 mOsm/kg (ref 300–900)

## 2022-11-07 LAB — PREALBUMIN: Prealbumin: 19 mg/dL (ref 18–38)

## 2022-11-07 MED ORDER — DEXTROSE IN LACTATED RINGERS 5 % IV SOLN: INTRAVENOUS | Status: DC

## 2022-11-07 NOTE — Progress Notes (Signed)
Transition of Care [facility]) - Inpatient Brief Assessment   Patient Details  Name: Larry Lozano MRN: [id number] Date of Birth: 03/14/1961  [facility] CM/SW Contact:    Janae Bridgeman, RN Phone Number: 11/07/2022, 8:55 AM   Clinical Narrative: Substance abuse resources placed in the AVS.   Transition of Care Asessment: Insurance and Status: (P) Insurance coverage has been reviewed Patient has primary care physician: (P) Yes Home environment has been reviewed: (P) Yes Prior level of function:: (P) Independent Prior/Current Home Services: (P) No current home services Social Determinants of Health Reivew: (P) SDOH reviewed interventions complete (Substance abuse resources placed in the AVS.)   Transition of care needs: (P) no transition of care needs at this time

## 2022-11-07 NOTE — Plan of Care (Signed)
  Problem: Education: Goal: Knowledge of General Education information will improve Description: Including pain rating scale, medication(s)/side effects and non-pharmacologic comfort measures Outcome: Progressing   Problem: Health Behavior/Discharge Planning: Goal: Ability to manage health-related needs will improve Outcome: Progressing   Problem: Clinical Measurements: Goal: Ability to maintain clinical measurements within normal limits will improve Outcome: Progressing Goal: Will remain free from infection Outcome: Progressing   Problem: Activity: Goal: Risk for activity intolerance will decrease Outcome: Progressing   Problem: Coping: Goal: Level of anxiety will decrease Outcome: Progressing   Problem: Elimination: Goal: Will not experience complications related to bowel motility Outcome: Progressing Goal: Will not experience complications related to urinary retention Outcome: Progressing   Problem: Safety: Goal: Ability to remain free from injury will improve Outcome: Progressing   Problem: Skin Integrity: Goal: Risk for impaired skin integrity will decrease Outcome: Progressing

## 2022-11-07 NOTE — TOC Progression Note (Signed)
Transition of Care [facility]) - Initial/Assessment Note    Patient Details  Name: Larry Lozano MRN: [id number] Date of Birth: Sep 04, 1960  [facility]) CM/SW Contact:    Ralene Bathe, LCSW Phone Number: 11/07/2022, 3:19 PM  Clinical Narrative:                 LCSW notified by floor RN that  patient's daughter, [facility], requested a returned call.  LCSW called the number listed for the daughter at 14:58.  There was no answer.  Voicemail left requesting a returned call.          Patient Goals and CMS Choice            Expected Discharge Plan and Services                                              Prior Living Arrangements/Services                       Activities of Daily Living Home Assistive Devices/Equipment: Eyeglasses, Gilmer Mor (specify quad or straight) ADL Screening (condition at time of admission) Patient's cognitive ability adequate to safely complete daily activities?: Yes Is the patient deaf or have difficulty hearing?: No Does the patient have difficulty seeing, even when wearing glasses/contacts?: Yes Does the patient have difficulty concentrating, remembering, or making decisions?: No Patient able to express need for assistance with ADLs?: Yes Does the patient have difficulty dressing or bathing?: No Independently performs ADLs?: Yes (appropriate for developmental age) Does the patient have difficulty walking or climbing stairs?: No Weakness of Legs: None Weakness of Arms/Hands: None  Permission Sought/Granted                  Emotional Assessment              Admission diagnosis:  Alcohol withdrawal (HCC) [F10.939] Seizure (HCC) [R56.9] Patient Active Problem List   Diagnosis Date Noted   Alcohol withdrawal (HCC) 11/06/2022   Metabolic acidosis 11/06/2022   Stroke-like symptoms 11/06/2022   Preventative health care 09/21/2021   Multiple joint pain 09/21/2021   History of rheumatoid arthritis 09/21/2021    Tobacco abuse 09/21/2021   Transient loss of consciousness 12/02/2016   Alcohol abuse 12/02/2016   Thrombocytopenia (HCC) 12/02/2016   PCP:  Eden Emms, NP Pharmacy:   [facility] - [place], [place] - [phone], [address] [place] [address] Phone: [phone] Fax: [phone]  [id number] - [place], [place] - [address] [place] [address] Phone: [phone] Fax: [phone]     Social Determinants of Health (SDOH) Social History: SDOH Screenings   Food Insecurity: No Food Insecurity (11/07/2022)  Housing: High Risk (11/07/2022)  Transportation Needs: Unmet Transportation Needs (11/07/2022)  Utilities: At Risk (11/07/2022)  Depression (PHQ2-9): Medium Risk (09/21/2021)  Tobacco Use: High Risk (11/06/2022)   SDOH Interventions:     Readmission Risk Interventions     No data to display

## 2022-11-07 NOTE — Plan of Care (Signed)
Following up on the recommendations from the consultant of note  EEG normal.  MRI with no acute findings.  Plan as in the consult note-no need for AEDs.  Provide seizure precautions.  Please call inpatient neurology with questions as needed   -- Milon Dikes MD Neurology

## 2022-11-07 NOTE — Progress Notes (Signed)
PROGRESS NOTE    Larry Lozano  UEA:[id number] DOB: 05-26-1960 DOA: 11/06/2022 PCP: Eden Emms, NP    Brief Narrative:  62 year old with history of alcohol abuse, previous history of stroke, ongoing smoker presented from home with episode of seizure, right-sided weakness and aphasia.  EMS witnessed grand mal seizure and was given 5 mg Versed.  Drinks about 24 cans of beer a day.  Brought to the ER as a code stroke.  Admitted for alcohol withdrawal syndrome, alcohol withdrawal seizure.   Assessment & Plan:   Alcohol withdrawal syndrome, alcohol withdrawal seizure: Monitor in the hospital.  Seizure precautions.  Fall precautions. Benzodiazepine based CIWA protocol. Multivitamins with thiamine and folic acid. Grand mal seizure was suspected secondary to alcohol withdrawal, loaded with Keppra in the emergency room.  Neurology does not suggest any ongoing antiseizure medications.  Followed by neurology. Electrolytes are adequate. Cessation counseling done, unsure he will be successful.  Smoker: Counseled to quit.  Nicotine patch available.  Strokelike symptoms: Suspected Todd's paralysis.  Currently no neurological deficits.   DVT prophylaxis: SCDs   Code Status: Full code Family Communication: None at bedside, sister Hilarie Fredrickson called unable to reach  Disposition Plan: Status is: Observation The patient will require care spanning > 2 midnights and should be moved to inpatient because: Significant alcohol withdrawal syndrome needing inpatient treatment and stabilization.     Consultants:  Neurology  Procedures:  None  Antimicrobials:  None   Subjective: Patient seen and examined.  He tells me he is craving for cigarettes, provided nicotine patch.  Denies any nausea vomiting.  Patient tells me that he has 2 days to get his stuffs out of his house so he has to go home.  We discussed about severe medical condition and remaining in the hospital to complete  treatment.  Objective: Vitals:   11/06/22 2225 11/06/22 2339 11/07/22 0336 11/07/22 0758  BP:  122/78 130/81 138/87  Pulse:  65 71 74  Resp: 20 18 18 18   Temp:  97.6 F (36.4 C) 98.3 F (36.8 C) 97.7 F (36.5 C)  TempSrc:  Oral Oral Oral  SpO2:  98% 98% 99%  Weight: 55.6 kg     Height: 5\' 9"  (1.753 m)       Intake/Output Summary (Last 24 hours) at 11/07/2022 1113 Last data filed at 11/07/2022 4782 Gross per 24 hour  Intake 705.08 ml  Output 600 ml  Net 105.08 ml   Filed Weights   11/06/22 1704 11/06/22 2225  Weight: 61 kg 55.6 kg    Examination:  General exam: Appears calm and comfortable  Patient is shaky and tremulous.  He does not have any asterixis or flapping tremors. Respiratory system: Clear to auscultation. Respiratory effort normal. Cardiovascular system: S1 & S2 heard, RRR. No pedal edema. Gastrointestinal system: Abdomen is nondistended, soft and nontender. No organomegaly or masses felt. Normal bowel sounds heard. Central nervous system: Alert and oriented. No focal neurological deficits. Psychiatry: Judgement and insight appear normal.flat affect.     Data Reviewed: I have personally reviewed following labs and imaging studies  CBC: Recent Labs  Lab 11/06/22 1636 11/06/22 1641 11/06/22 2112  WBC 12.0*  --   --   NEUTROABS 8.3*  --   --   HGB 15.5 17.0 15.6  HCT 46.1 50.0 46.0  MCV 95.1  --   --   PLT 172  --   --    Basic Metabolic Panel: Recent Labs  Lab 11/06/22 1636 11/06/22 1641 11/06/22  2053 11/06/22 2112  NA 134* 134* 135 136  K 3.8 3.8 4.2 4.2  CL 98 102 100  --   CO2 9*  --  24  --   GLUCOSE 108* 105* 101*  --   BUN 5* 4* <5*  --   CREATININE 0.96 0.80 0.80  --   CALCIUM 9.7  --  9.4  --   MG  --   --  1.8  --   PHOS  --   --  3.3  --    GFR: Estimated Creatinine Clearance: 77.2 mL/min (by C-G formula based on SCr of 0.8 mg/dL). Liver Function Tests: Recent Labs  Lab 11/06/22 1636  AST 63*  ALT 63*  ALKPHOS 73   BILITOT 0.4  PROT 6.6  ALBUMIN 4.4   Recent Labs  Lab 11/06/22 2053  LIPASE 88*   Recent Labs  Lab 11/06/22 2053  AMMONIA 25   Coagulation Profile: Recent Labs  Lab 11/06/22 1636  INR 1.0   Cardiac Enzymes: Recent Labs  Lab 11/06/22 2053  CKTOTAL 117   BNP (last 3 results) No results for input(s): "PROBNP" in the last 8760 hours. HbA1C: No results for input(s): "HGBA1C" in the last 72 hours. CBG: Recent Labs  Lab 11/06/22 1810  GLUCAP 97   Lipid Profile: No results for input(s): "CHOL", "HDL", "LDLCALC", "TRIG", "CHOLHDL", "LDLDIRECT" in the last 72 hours. Thyroid Function Tests: Recent Labs    11/06/22 2053  TSH 2.223   Anemia Panel: No results for input(s): "VITAMINB12", "FOLATE", "FERRITIN", "TIBC", "IRON", "RETICCTPCT" in the last 72 hours. Sepsis Labs: Recent Labs  Lab 11/06/22 2052 11/06/22 2053  PROCALCITON  --  <0.10  LATICACIDVEN 1.9  --     No results found for this or any previous visit (from the past 240 hour(s)).       Radiology Studies: DG CHEST PORT 1 VIEW  Result Date: 11/06/2022 CLINICAL DATA:  Difficulty breathing EXAM: PORTABLE CHEST 1 VIEW COMPARISON:  None Available. FINDINGS: The heart size and mediastinal contours are within normal limits. Both lungs are clear. The visualized skeletal structures are unremarkable. IMPRESSION: No active disease. Electronically Signed   By: Alcide Clever M.D.   On: 11/06/2022 20:30   MR BRAIN WO CONTRAST  Result Date: 11/06/2022 CLINICAL DATA:  Initial evaluation for transient right-sided weakness and aphasia. EXAM: MRI HEAD WITHOUT CONTRAST TECHNIQUE: Multiplanar, multiecho pulse sequences of the brain and surrounding structures were obtained without intravenous contrast. COMPARISON:  Prior CTs from earlier the same day. FINDINGS: Brain: Examination mildly degraded by motion artifact. Diffuse prominence of the CSF containing spaces compatible with generalized cerebral atrophy, mildly advanced  for age. No significant cerebral white matter disease. No evidence for acute or subacute ischemia. Gray-white matter adjacent intending. No areas of chronic cortical infarction. No acute or chronic intracranial blood products. No mass lesion, midline shift or mass effect. No hydrocephalus or extra-axial fluid collection. Pituitary gland suprasellar region within normal limits. Vascular: Major intracranial vascular flow voids are maintained. Hypoplastic left vertebral artery noted. Skull and upper cervical spine: Craniocervical junction within normal limits. Bone marrow signal intensity normal. No scalp soft tissue abnormality. Sinuses/Orbits: Globes and orbital soft tissues within normal limits. Mild scattered mucosal thickening noted about the ethmoidal air cells and maxillary sinuses. 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 IMPRESSION:  1. No acute intracranial abnormality. 2. Mildly advanced cerebral atrophy for age. Electronically Signed   By: Rise Mu M.D.   On: 11/06/2022 20:17   EEG adult  Result Date: 11/06/2022 Jefferson Fuel, MD     11/06/2022  6:54 PM Routine EEG Report Mancil SAVVAS ROPER is a 62 y.o. male with a history of seizure who is undergoing an EEG to evaluate for seizures. Report: This EEG was acquired with electrodes placed according to the International 10-20 electrode system (including Fp1, Fp2, F3, F4, C3, C4, P3, P4, O1, O2, T3, T4, T5, T6, A1, A2, Fz, Cz, Pz). The following electrodes were missing or displaced: none. The best background was 9 Hz, symmetric, and reactive to stimulation. There was no clear waking rhythm. Deeper stages of sleep were identified by sleep spindles. There are overriding beta frequencies. There was no focal slowing. There were no interictal epileptiform discharges. There were no  electrographic seizures identified. Photic stimulation and hyperventilation were not performed. Impression: This EEG was obtained while asleep and is normal.   Clinical Correlation: Normal EEGs, however, do not rule out epilepsy. Bing Neighbors, MD [facility] [phone] If 7pm- 7am, please page neurology on call as listed in AMION.   CT ANGIO HEAD NECK W WO CM (CODE STROKE)  Result Date: 11/06/2022 CLINICAL DATA:  Neuro deficit, acute, stroke suspected. Right-sided weakness and aphasia. Seizure. EXAM: CT ANGIOGRAPHY HEAD AND NECK WITH AND WITHOUT CONTRAST TECHNIQUE: Multidetector CT imaging of the head and neck was performed using the standard protocol during bolus administration of intravenous contrast. Multiplanar CT image reconstructions and MIPs were obtained to evaluate the vascular anatomy. Carotid stenosis measurements (when applicable) are obtained utilizing NASCET criteria, using the distal internal carotid diameter as the denominator. RADIATION DOSE REDUCTION: This exam was performed according to the departmental dose-optimization program which includes automated exposure control, adjustment of the mA and/or kV according to patient size and/or use of iterative reconstruction technique. CONTRAST:  75mL OMNIPAQUE IOHEXOL 350 MG/ML SOLN COMPARISON:  None Available. FINDINGS: CTA NECK FINDINGS Aortic arch: Standard 3 vessel aortic arch with widely patent arch vessel origins. Right carotid system: Patent without evidence of stenosis or dissection. Left carotid system: Patent with minimal calcified plaque at the carotid bifurcation. No evidence of stenosis or dissection. Vertebral arteries: Patent without evidence of stenosis or dissection. Strongly dominant right vertebral artery. 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 Advanced disc degeneration at C7-T1. Other neck: No evidence of cervical lymphadenopathy or mass. Upper chest: Clear lung apices. Review of the MIP images confirms the  above findings CTA HEAD FINDINGS Anterior circulation: The internal carotid arteries are patent from skull base to carotid termini. There is atherosclerotic calcification in the left greater than right carotid siphons with mild-to-moderate stenosis of the left cavernous ICA. ACAs and MCAs are patent without evidence of a proximal branch occlusion or significant proximal stenosis. No aneurysm is identified. Posterior circulation: The intracranial right vertebral artery is widely patent and supplies the basilar. The left vertebral artery is hypoplastic and functionally terminates in PICA. Patent PICA and SCA origins are visualized bilaterally. The basilar artery is widely patent. There are small posterior communicating arteries bilaterally. Both PCAs are patent without evidence of a significant proximal stenosis. No aneurysm is identified. Venous sinuses: Patent. Anatomic variants: Hypoplastic left vertebral artery functionally terminating in PICA. Review of the MIP images confirms the above findings These results were communicated to Cortney de Saintclair Halsted at 5:01 pm on 11/06/2022 by text page via  the [employer] system. IMPRESSION: 1. No large vessel occlusion. 2. Intracranial atherosclerosis with mild-to-moderate left cavernous ICA stenosis. 3. Widely patent cervical carotid and vertebral arteries. Electronically Signed   By: Sebastian Ache M.D.   On: 11/06/2022 17:23   CT HEAD CODE STROKE WO CONTRAST  Result Date: 11/06/2022 CLINICAL DATA:  Code stroke. Neuro deficit, acute, stroke suspected. EXAM: CT HEAD WITHOUT CONTRAST TECHNIQUE: Contiguous axial images were obtained from the base of the skull through the vertex without intravenous contrast. RADIATION DOSE REDUCTION: This exam was performed according to the departmental dose-optimization program which includes automated exposure control, adjustment of the mA and/or kV according to patient size and/or use of iterative reconstruction technique. COMPARISON:   Head CT 12/02/2016 and MRI 12/03/2016 FINDINGS: Brain: There is no evidence of an acute infarct, intracranial hemorrhage, mass, midline shift, or extra-axial fluid collection. There is mild cerebral atrophy. Vascular: Calcified atherosclerosis at the skull base. No hyperdense vessel. Skull: No acute fracture or suspicious osseous lesion. Sinuses/Orbits: Mild mucosal thickening in the maxillary sinuses. Clear mastoid air cells. Unremarkable orbits. Other: None. ASPECTS (Alberta Stroke Program Early CT Score) - Ganglionic level infarction (caudate, lentiform nuclei, internal capsule, insula, M1-M3 cortex): 7 - Supraganglionic infarction (M4-M6 cortex): 3 Total score (0-10 with 10 being normal): 10 These results were communicated to Cortney de Saintclair Halsted at 5:01 pm on 11/06/2022 by text page via the [facility] messaging system. IMPRESSION: No evidence of acute intracranial abnormality. ASPECTS of 10. Electronically Signed   By: Sebastian Ache M.D.   On: 11/06/2022 17:02        Scheduled Meds:  folic acid  1 mg Oral Daily   multivitamin with minerals  1 tablet Oral Daily   nicotine  21 mg Transdermal Daily   thiamine  100 mg Oral Daily   Or   thiamine  100 mg Intravenous Daily   Continuous Infusions:  dextrose 5% lactated ringers 75 mL/hr at 11/07/22 0812     LOS: 0 days    Time spent: 35 minutes    Dorcas Carrow, MD [facility]

## 2022-11-08 DIAGNOSIS — R2990 Unspecified symptoms and signs involving the nervous system: Secondary | ICD-10-CM | POA: Diagnosis not present

## 2022-11-08 MED ORDER — VITAMIN B-1 100 MG PO TABS: 100.0000 mg | ORAL_TABLET | Freq: Every day | ORAL | 0 refills | Status: AC

## 2022-11-08 MED ORDER — ADULT MULTIVITAMIN W/MINERALS CH: 1.0000 | ORAL_TABLET | Freq: Every day | ORAL | 0 refills | Status: AC

## 2022-11-08 NOTE — Plan of Care (Signed)

## 2022-11-08 NOTE — Discharge Summary (Signed)
Physician Discharge Summary  Larry Lozano YNW:[id number] DOB: 06-02-1960 DOA: 11/06/2022  PCP: Eden Emms, NP  Admit date: 11/06/2022 Discharge date: 11/08/2022  Admitted From: Home Disposition: Home  Recommendations for Outpatient Follow-up:  Follow up with PCP in 1-2 weeks Please obtain BMP/CBC/magnesium/phosphorus in one week  Home Health: N/A Equipment/Devices: N/A  Discharge Condition: Stable CODE STATUS: Full code Diet recommendation: Regular diet  Discharge summary: 62 year old with history of alcohol abuse, previous history of stroke, ongoing smoker presented from home with episode of seizure, right-sided weakness and aphasia.  EMS witnessed grand mal seizure and was given 5 mg Versed.  Drinks about 24 cans of beer a day.  Brought to the ER as a code stroke.  Admitted for alcohol withdrawal syndrome, alcohol withdrawal seizure.  # Alcohol withdrawal syndrome, alcohol withdrawal seizure: Admitted due to significant symptoms.  However he has remained fairly comfortable without any recurrence of seizure or any symptoms since admission. Patient was started on symptom-based CIWA protocol, however he has not required any benzodiazepines since admission.  Remains on multivitamins that will be continued. Grand mal seizure was suspected secondary to alcohol withdrawal, loaded with Keppra in the emergency room.  Neurology does not suggest any ongoing antiseizure medications. electrolytes are adequate. Alcohol cessation counseling done.  Patient is motivated.  Outpatient resources were given.   Smoker: Counseled to quit.  Unlikely to quit.   Strokelike symptoms: Suspected Todd's paralysis.  Currently no neurological deficits.  Stabilized.  Due to episode of seizure, he was given instructions as suggested from [facility] Recommended [place] DMV statutes, patients with seizures are not allowed to drive until they have been seizure-free for six months. Use caution when using heavy equipment or  power tools. Avoid working on ladders or at heights. Take showers instead of baths. Ensure the water temperature is not too high on the home water heater. Do not go swimming alone. Do not lock yourself in a room alone (i.e. bathroom). Maintain good sleep hygiene. Avoid alcohol.     Stable for discharge.   Discharge Diagnoses:  Principal Problem:   Alcohol withdrawal (HCC) Active Problems:   History of rheumatoid arthritis   Tobacco abuse   Metabolic acidosis   Stroke-like symptoms    Discharge Instructions  Discharge Instructions     Diet general   Complete by: As directed    Discharge instructions   Complete by: As directed    Recommended [place] DMV statutes, patients with seizures are not allowed to drive until they have been seizure-free for six months. Use caution when using heavy equipment or power tools. Avoid working on ladders or at heights. Take showers instead of baths. Ensure the water temperature is not too high on the home water heater. Do not go swimming alone. Do not lock yourself in a room alone (i.e. bathroom). Maintain good sleep hygiene. Avoid alcohol.     Increase activity slowly   Complete by: As directed       Allergies as of 11/08/2022       Reactions   Methocarbamol Itching   Reaction to Robaxin        Medication List     TAKE these medications    multivitamin with minerals Tabs tablet Take 1 tablet by mouth daily.   thiamine 100 MG tablet Commonly known as: Vitamin B-1 Take 1 tablet (100 mg total) by mouth daily.        Allergies  Allergen Reactions   Methocarbamol Itching    Reaction to Robaxin  Consultations: Neurology   Procedures/Studies: DG CHEST PORT 1 VIEW  Result Date: 11/06/2022 CLINICAL DATA:  Difficulty breathing EXAM: PORTABLE CHEST 1 VIEW COMPARISON:  None Available. FINDINGS: The heart size and mediastinal contours are within normal limits. Both lungs are clear. The visualized skeletal structures are unremarkable.  IMPRESSION: No active disease. Electronically Signed   By: Alcide Clever M.D.   On: 11/06/2022 20:30   MR BRAIN WO CONTRAST  Result Date: 11/06/2022 CLINICAL DATA:  Initial evaluation for transient right-sided weakness and aphasia. EXAM: MRI HEAD WITHOUT CONTRAST TECHNIQUE: Multiplanar, multiecho pulse sequences of the brain and surrounding structures were obtained without intravenous contrast. COMPARISON:  Prior CTs from earlier the same day. FINDINGS: Brain: Examination mildly degraded by motion artifact. Diffuse prominence of the CSF containing spaces compatible with generalized cerebral atrophy, mildly advanced for age. No significant cerebral white matter disease. No evidence for acute or subacute ischemia. Gray-white matter adjacent intending. No areas of chronic cortical infarction. No acute or chronic intracranial blood products. No mass lesion, midline shift or mass effect. No hydrocephalus or extra-axial fluid collection. Pituitary gland suprasellar region within normal limits. Vascular: Major intracranial vascular flow voids are maintained. Hypoplastic left vertebral artery noted. Skull and upper cervical spine: Craniocervical junction within normal limits. Bone marrow signal intensity normal. No scalp soft tissue abnormality. Sinuses/Orbits: Globes and orbital soft tissues within normal limits. Mild scattered mucosal thickening noted about the ethmoidal air cells and maxillary sinuses. 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 IMPRESSION: 1. No acute intracranial abnormality. 2. Mildly advanced cerebral atrophy for age. Electronically Signed   By: Rise Mu M.D.   On: 11/06/2022 20:17   EEG adult  Result Date: 11/06/2022 Jefferson Fuel, MD     11/06/2022  6:54 PM Routine EEG Report Sion EBENEZER MCCASKEY is a 62 y.o. male  with a history of seizure who is undergoing an EEG to evaluate for seizures. Report: This EEG was acquired with electrodes placed according to the International 10-20 electrode system (including Fp1, Fp2, F3, F4, C3, C4, P3, P4, O1, O2, T3, T4, T5, T6, A1, A2, Fz, Cz, Pz). The following electrodes were missing or displaced: none. The best background was 9 Hz, symmetric, and reactive to stimulation. There was no clear waking rhythm. Deeper stages of sleep were identified by sleep spindles. There are overriding beta frequencies. There was no focal slowing. There were no interictal epileptiform discharges. There were no electrographic seizures identified. Photic stimulation and hyperventilation were not performed. Impression: This EEG was obtained while asleep and is normal.   Clinical Correlation: Normal EEGs, however, do not rule out epilepsy. Bing Neighbors, MD [facility] [phone] If 7pm- 7am, please page neurology on call as listed in AMION.   CT ANGIO HEAD NECK W WO CM (CODE STROKE)  Result Date: 11/06/2022 CLINICAL DATA:  Neuro deficit, acute, stroke suspected. Right-sided weakness and aphasia. Seizure. EXAM: CT ANGIOGRAPHY HEAD AND NECK WITH AND WITHOUT CONTRAST TECHNIQUE: Multidetector CT imaging of the head and neck was performed using the standard protocol during bolus administration of intravenous contrast. Multiplanar CT image reconstructions and MIPs were obtained to evaluate the vascular anatomy. Carotid stenosis measurements (when applicable) are obtained utilizing NASCET criteria, using the distal internal carotid diameter as the denominator. RADIATION DOSE REDUCTION: This exam was performed according to the departmental dose-optimization program  which includes automated exposure control, adjustment of the mA and/or kV according to patient size and/or use of iterative reconstruction technique. CONTRAST:  75mL OMNIPAQUE IOHEXOL 350 MG/ML SOLN COMPARISON:  None Available. FINDINGS: CTA  NECK FINDINGS Aortic arch: Standard 3 vessel aortic arch with widely patent arch vessel origins. Right carotid system: Patent without evidence of stenosis or dissection. Left carotid system: Patent with minimal calcified plaque at the carotid bifurcation. No evidence of stenosis or dissection. Vertebral arteries: Patent without evidence of stenosis or dissection. Strongly dominant right vertebral artery. 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 Advanced disc degeneration at C7-T1. Other neck: No evidence of cervical lymphadenopathy or mass. Upper chest: Clear lung apices. Review of the MIP images confirms the above findings CTA HEAD FINDINGS Anterior circulation: The internal carotid arteries are patent from skull base to carotid termini. There is atherosclerotic calcification in the left greater than right carotid siphons with mild-to-moderate stenosis of the left cavernous ICA. ACAs and MCAs are patent without evidence of a proximal branch occlusion or significant proximal stenosis. No aneurysm is identified. Posterior circulation: The intracranial right vertebral artery is widely patent and supplies the basilar. The left vertebral artery is hypoplastic and functionally terminates in PICA. Patent PICA and SCA origins are visualized bilaterally. The basilar artery is widely patent. There are small posterior communicating arteries bilaterally. Both PCAs are patent without evidence of a significant proximal stenosis. No aneurysm is identified. Venous sinuses: Patent. Anatomic variants: Hypoplastic left vertebral artery functionally terminating in PICA. Review of the MIP images confirms the above findings These results were communicated to Cortney de Saintclair Halsted at 5:01 pm on 11/06/2022 by text page via the [facility] messaging system. IMPRESSION: 1. No large vessel occlusion. 2. Intracranial atherosclerosis with mild-to-moderate left cavernous ICA stenosis. 3. Widely patent cervical carotid and vertebral arteries.  Electronically Signed   By: Sebastian Ache M.D.   On: 11/06/2022 17:23   CT HEAD CODE STROKE WO CONTRAST  Result Date: 11/06/2022 CLINICAL DATA:  Code stroke. Neuro deficit, acute, stroke suspected. EXAM: CT HEAD WITHOUT CONTRAST TECHNIQUE: Contiguous axial images were obtained from the base of the skull through the vertex without intravenous contrast. RADIATION DOSE REDUCTION: This exam was performed according to the departmental dose-optimization program which includes automated exposure control, adjustment of the mA and/or kV according to patient size and/or use of iterative reconstruction technique. COMPARISON:  Head CT 12/02/2016 and MRI 12/03/2016 FINDINGS: Brain: There is no evidence of an acute infarct, intracranial hemorrhage, mass, midline shift, or extra-axial fluid collection. There is mild cerebral atrophy. Vascular: Calcified atherosclerosis at the skull base. No hyperdense vessel. Skull: No acute fracture or suspicious osseous lesion. Sinuses/Orbits: Mild mucosal thickening in the maxillary sinuses. Clear mastoid air cells. Unremarkable orbits. Other: None. ASPECTS (Alberta Stroke Program Early CT Score) - Ganglionic level infarction (caudate, lentiform nuclei, internal capsule, insula, M1-M3 cortex): 7 - Supraganglionic infarction (M4-M6 cortex): 3 Total score (0-10 with 10 being normal): 10 These results were communicated to Cortney de Saintclair Halsted at 5:01 pm on 11/06/2022 by text page via the [facility] messaging system. IMPRESSION: No evidence of acute intracranial abnormality. ASPECTS of 10. Electronically Signed   By: Sebastian Ache M.D.   On: 11/06/2022 17:02   (Echo, Carotid, EGD, Colonoscopy, ERCP)    Subjective: Patient seen in the morning rounds.  Denies any complaints.  He will go to his mother's house.  He is going through a lot including eviction from his home that is stressful.  He is  determined not to drink again heavily.   Discharge Exam: Vitals:   11/08/22 0441 11/08/22 0735  BP: (!)  144/73 (!) 142/79  Pulse: 60 (!) 59  Resp: 20 18  Temp: 97.6 F (36.4 C) 98.2 F (36.8 C)  SpO2: 100% 98%   Vitals:   11/07/22 2011 11/08/22 0017 11/08/22 0441 11/08/22 0735  BP: 126/76 (!) 143/83 (!) 144/73 (!) 142/79  Pulse: 60 61 60 (!) 59  Resp: 17 18 20 18   Temp: (!) 97.4 F (36.3 C) 98.2 F (36.8 C) 97.6 F (36.4 C) 98.2 F (36.8 C)  TempSrc: Oral Oral Oral Oral  SpO2: 100% 100% 100% 98%  Weight:      Height:        General: Pt is alert, awake, not in acute distress Walking around in the room.  Thin and frail.  No evidence of tremors or asterixis. Cardiovascular: RRR, S1/S2 +, no rubs, no gallops Respiratory: CTA bilaterally, no wheezing, no rhonchi Abdominal: Soft, NT, ND, bowel sounds + Extremities: no edema, no cyanosis    The results of significant diagnostics from this hospitalization (including imaging, microbiology, ancillary and laboratory) are listed below for reference.     Microbiology: No results found for this or any previous visit (from the past 240 hour(s)).   Labs: BNP (last 3 results) No results for input(s): "BNP" in the last 8760 hours. Basic Metabolic Panel: Recent Labs  Lab 11/06/22 1636 11/06/22 1641 11/06/22 2053 11/06/22 2112  NA 134* 134* 135 136  K 3.8 3.8 4.2 4.2  CL 98 102 100  --   CO2 9*  --  24  --   GLUCOSE 108* 105* 101*  --   BUN 5* 4* <5*  --   CREATININE 0.96 0.80 0.80  --   CALCIUM 9.7  --  9.4  --   MG  --   --  1.8  --   PHOS  --   --  3.3  --    Liver Function Tests: Recent Labs  Lab 11/06/22 1636  AST 63*  ALT 63*  ALKPHOS 73  BILITOT 0.4  PROT 6.6  ALBUMIN 4.4   Recent Labs  Lab 11/06/22 2053  LIPASE 88*   Recent Labs  Lab 11/06/22 2053  AMMONIA 25   CBC: Recent Labs  Lab 11/06/22 1636 11/06/22 1641 11/06/22 2112  WBC 12.0*  --   --   NEUTROABS 8.3*  --   --   HGB 15.5 17.0 15.6  HCT 46.1 50.0 46.0  MCV 95.1  --   --   PLT 172  --   --    Cardiac Enzymes: Recent Labs  Lab  11/06/22 2053  CKTOTAL 117   BNP: Invalid input(s): "POCBNP" CBG: Recent Labs  Lab 11/06/22 1810  GLUCAP 97   D-Dimer No results for input(s): "DDIMER" in the last 72 hours. Hgb A1c No results for input(s): "HGBA1C" in the last 72 hours. Lipid Profile No results for input(s): "CHOL", "HDL", "LDLCALC", "TRIG", "CHOLHDL", "LDLDIRECT" in the last 72 hours. Thyroid function studies Recent Labs    11/06/22 2053  TSH 2.223   Anemia work up No results for input(s): "VITAMINB12", "FOLATE", "FERRITIN", "TIBC", "IRON", "RETICCTPCT" in the last 72 hours. Urinalysis    Component Value Date/Time   COLORURINE AMBER (A) 12/02/2016 1702   APPEARANCEUR HAZY (A) 12/02/2016 1702   LABSPEC 1.020 12/02/2016 1702   PHURINE 5.0 12/02/2016 1702   GLUCOSEU NEGATIVE 12/02/2016 1702   HGBUR SMALL (A)  12/02/2016 1702   BILIRUBINUR NEGATIVE 12/02/2016 1702   KETONESUR 5 (A) 12/02/2016 1702   PROTEINUR 100 (A) 12/02/2016 1702   UROBILINOGEN 0.2 10/03/2006 1026   NITRITE NEGATIVE 12/02/2016 1702   LEUKOCYTESUR NEGATIVE 12/02/2016 1702   Sepsis Labs Recent Labs  Lab 11/06/22 1636  WBC 12.0*   Microbiology No results found for this or any previous visit (from the past 240 hour(s)).   Time coordinating discharge: 35 minutes  SIGNED:   Dorcas Carrow, MD  [facility] 11/08/2022, 8:50 AM

## 2022-11-09 ENCOUNTER — Telehealth: Payer: Self-pay

## 2022-11-09 LAB — VITAMIN B1: Vitamin B1 (Thiamine): 155.4 nmol/L (ref 66.5–200.0)

## 2022-11-09 NOTE — Transitions of Care (Post Inpatient/ED Visit) (Signed)
   11/09/2022  Name: Larry Lozano MRN: [id number] DOB: Jun 27, 1960  Today's TOC FU Call Status: Today's TOC FU Call Status:: Unsuccessul Call (1st Attempt) Unsuccessful Call (1st Attempt) Date: 11/09/22  Attempted to reach the patient regarding the most recent Inpatient/ED visit.  Follow Up Plan: Additional outreach attempts will be made to reach the patient to complete the Transitions of Care (Post Inpatient/ED visit) call.   Signature   Woodfin Ganja LPN [facility] Nurse Health Advisor Direct Dial [phone]

## 2022-11-10 NOTE — Transitions of Care (Post Inpatient/ED Visit) (Signed)
   11/10/2022  Name: Larry Lozano MRN: [id number] DOB: Mar 17, 1961  Today's TOC FU Call Status: Today's TOC FU Call Status:: Unsuccessful Call (2nd Attempt) Unsuccessful Call (1st Attempt) Date: 11/09/22 Unsuccessful Call (2nd Attempt) Date: 11/10/22  Attempted to reach the patient regarding the most recent Inpatient/ED visit.  Follow Up Plan: Additional outreach attempts will be made to reach the patient to complete the Transitions of Care (Post Inpatient/ED visit) call.   Signature   Woodfin Ganja LPN [facility] Nurse Health Advisor Direct Dial [phone]

## 2022-11-14 NOTE — Transitions of Care (Post Inpatient/ED Visit) (Signed)
   11/14/2022  Name: Larry Lozano MRN: [id number] DOB: 26-Dec-1960  Today's TOC FU Call Status: Today's TOC FU Call Status:: Successful TOC FU Call Competed Unsuccessful Call (1st Attempt) Date: 11/09/22 Unsuccessful Call (2nd Attempt) Date: 11/10/22 Unsuccessful Call (3rd Attempt) Date: 11/14/22 [facility] FU Call Complete Date: 11/14/22  Attempted to reach the patient regarding the most recent Inpatient/ED visit.  Follow Up Plan: No further outreach attempts will be made at this time. We have been unable to contact the patient.  Signature   Woodfin Ganja LPN [facility] Nurse Health Advisor Direct Dial [phone]

## 2022-12-06 ENCOUNTER — Telehealth: Payer: Self-pay | Admitting: Nurse Practitioner

## 2022-12-06 NOTE — Telephone Encounter (Signed)
Patient last seen 06.2023- called patient to schedule annual physical, left voicemail

## 2024-04-25 ENCOUNTER — Ambulatory Visit: Payer: Self-pay

## 2024-04-25 NOTE — Telephone Encounter (Signed)
Agree with being evaluated  

## 2024-04-25 NOTE — Telephone Encounter (Signed)
 Called and spoke with pt. Pt states she was sitting in chair and felt a pop in left knee when standing up. Pt states he is able to ambulate with walker. No swelling visible to pt but sides of knee is tender to touch. Pt currently staying off left leg and keeping it propped up.   Pt states he does not currently have transportation as his family member is at a dr appt. But states he will call when they get back with a update. Pt does not wish to go to ED but will try to go to [facility] when able

## 2024-04-25 NOTE — Telephone Encounter (Signed)
 FYI Only or Action Required?: Action required by provider: request for appointment and declines ED.  Patient was last seen in primary care on 09/21/2021 by Wendee Lynwood HERO, NP.  Called Nurse Triage reporting Knee Pain.  Symptoms began yesterday.  Interventions attempted: OTC medications: tylenol .  Symptoms are: unchanged.  Triage Disposition: See HCP Within 4 Hours (Or PCP Triage)  Patient/caregiver understands and will follow disposition?: No, wishes to speak with PCP    Copied from CRM #[id number]. Topic: Clinical - Red Word Triage >> Apr 25, 2024  9:40 AM Zebedee SAUNDERS wrote: Red Word that prompted transfer to Nurse Triage: Pt stated his left knee popped and has severe pain. Reason for Disposition  [1] SEVERE pain (e.g., excruciating, unable to walk) AND [2] not improved after 2 hours of pain medicine  Answer Assessment - Initial Assessment Questions No available appts within 4 hours. Advised ED now, patient declines.  Patient requesting appt with pcp today or tomorrow.  Advised call back or ED/911 if symptoms worsen. Patient verbalized understanding.   1. LOCATION and RADIATION: Where is the pain located?      Left knee pain; unable to bear full weight, reports dragging leg and using walker, popping sound, knee gave out yesterday caused fall; denies head injury/ injuries, landed on hands 2. QUALITY: What does the pain feel like?  (e.g., sharp, dull, aching, burning)    burning 3. SEVERITY: How bad is the pain? What does it keep you from doing?   (Scale 1-10; or mild, moderate, severe)     8/10; tylenol  4. ONSET: When did the pain start? Does it come and go, or is it there all the time?     yesterday 5. RECURRENT: Have you had this pain before? If Yes, ask: When, and what happened then?     With right knee 6. 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 AGGRAVATING FACTORS: What makes  the knee pain worse? (e.g., walking, climbing stairs, running)     Standing, bending and extending 8. ASSOCIATED SYMPTOMS: Is there any swelling or redness of the knee?     Slight swelling knee 9. OTHER SYMPTOMS: Do you have any other symptoms? (e.g., calf pain, chest pain, difficulty breathing, fever) Denies diff breathing, chest pain, fever, chills, n/v, calf pain, numbness/weakness, cool to touch, hot to touch  tingling and numbness comes and goes, both feet and hands; 3 months ago  Protocols used: Knee Pain-A-AH

## 2024-04-29 ENCOUNTER — Ambulatory Visit: Payer: Self-pay

## 2024-04-29 NOTE — Telephone Encounter (Signed)
 FYI Only or Action Required?: FYI only for provider: appointment scheduled on 01.14.26.  Patient was last seen in primary care on 09/21/2021 by Wendee Lynwood HERO, NP.  Called Nurse Triage reporting Dysphagia.  Symptoms began several months ago.  Interventions attempted: Nothing.  Symptoms are: stable.  Triage Disposition: See Physician Within 24 Hours  Patient/caregiver understands and will follow disposition?: Yes   Copied from CRM #[id number]. Topic: Clinical - Red Word Triage >> Apr 29, 2024  3:29 PM Joesph NOVAK wrote: Red Word that prompted transfer to Nurse Triage: swallowing difficulty Reason for Disposition  [1] Swallowing difficulty AND [2] cause unknown  (Exception: Difficulty swallowing is a chronic symptom.)  Answer Assessment - Initial Assessment Questions 1. DESCRIPTION: Tell me more about this problem. Are you  having trouble swallowing liquids, solids, or both? Any trouble with swallowing saliva (spit)?      Can swallow saliva but sometimes hard to swallow food  2. SEVERITY: How bad is the swallowing difficulty?  (Scale 1-10; or mild, moderate, severe)      Pt states he has to chew his food up really well to swallow it  3. ONSET: When did the swallowing problems begin?      X a few months   4. CHRONIC or RECURRENT: Is this a new problem for you?  If No, ask: How long have you had this problem? (e.g., days, weeks, months)      Recurrent   5. OTHER SYMPTOMS: Pt c./o knee pain where pt was advised to go to ED, however, declined. Pt states he was suppose to be scheduled for an appt today, 01.13.26, however there are no appts for pt today.  Pt scheduled for an in clinic visit on 01.14.26 for symptoms of difficulty swallowing and knee pain.  Pt agrees with plan of care, will call back for any worsening symptoms  Protocols used: Swallowing Difficulty-A-AH

## 2024-04-29 NOTE — Telephone Encounter (Signed)
 Noted

## 2024-05-01 ENCOUNTER — Ambulatory Visit: Admitting: Family Medicine

## 2024-05-01 ENCOUNTER — Encounter: Payer: Self-pay | Admitting: Family Medicine

## 2024-05-01 VITALS — BP 118/80 | HR 90 | Temp 98.0°F | Ht 69.0 in | Wt 122.0 lb

## 2024-05-01 DIAGNOSIS — R636 Underweight: Secondary | ICD-10-CM | POA: Diagnosis not present

## 2024-05-01 DIAGNOSIS — Z681 Body mass index (BMI) 19 or less, adult: Secondary | ICD-10-CM | POA: Diagnosis not present

## 2024-05-01 DIAGNOSIS — R2681 Unsteadiness on feet: Secondary | ICD-10-CM

## 2024-05-01 DIAGNOSIS — R202 Paresthesia of skin: Secondary | ICD-10-CM | POA: Diagnosis not present

## 2024-05-01 DIAGNOSIS — G629 Polyneuropathy, unspecified: Secondary | ICD-10-CM | POA: Insufficient documentation

## 2024-05-01 DIAGNOSIS — R1310 Dysphagia, unspecified: Secondary | ICD-10-CM | POA: Diagnosis not present

## 2024-05-01 DIAGNOSIS — Z13220 Encounter for screening for lipoid disorders: Secondary | ICD-10-CM

## 2024-05-01 DIAGNOSIS — R200 Anesthesia of skin: Secondary | ICD-10-CM

## 2024-05-01 LAB — CBC WITH DIFFERENTIAL/PLATELET
Basophils Absolute: 0.1 K/uL (ref 0.0–0.1)
Basophils Relative: 0.8 % (ref 0.0–3.0)
Eosinophils Absolute: 0.1 K/uL (ref 0.0–0.7)
Eosinophils Relative: 1 % (ref 0.0–5.0)
HCT: 42.3 % (ref 39.0–52.0)
Hemoglobin: 14.4 g/dL (ref 13.0–17.0)
Lymphocytes Relative: 25.2 % (ref 12.0–46.0)
Lymphs Abs: 1.6 K/uL (ref 0.7–4.0)
MCHC: 34.1 g/dL (ref 30.0–36.0)
MCV: 92.2 fl (ref 78.0–100.0)
Monocytes Absolute: 0.5 K/uL (ref 0.1–1.0)
Monocytes Relative: 7.6 % (ref 3.0–12.0)
Neutro Abs: 4.2 K/uL (ref 1.4–7.7)
Neutrophils Relative %: 65.4 % (ref 43.0–77.0)
Platelets: 184 K/uL (ref 150.0–400.0)
RBC: 4.59 Mil/uL (ref 4.22–5.81)
RDW: 11.7 % (ref 11.5–15.5)
WBC: 6.5 K/uL (ref 4.0–10.5)

## 2024-05-01 LAB — COMPREHENSIVE METABOLIC PANEL WITH GFR
ALT: 18 U/L (ref 3–53)
AST: 15 U/L (ref 5–37)
Albumin: 4.3 g/dL (ref 3.5–5.2)
Alkaline Phosphatase: 55 U/L (ref 39–117)
BUN: 8 mg/dL (ref 6–23)
CO2: 30 meq/L (ref 19–32)
Calcium: 10.3 mg/dL (ref 8.4–10.5)
Chloride: 106 meq/L (ref 96–112)
Creatinine, Ser: 0.69 mg/dL (ref 0.40–1.50)
GFR: 99.21 mL/min
Glucose, Bld: 99 mg/dL (ref 70–99)
Potassium: 3.8 meq/L (ref 3.5–5.1)
Sodium: 140 meq/L (ref 135–145)
Total Bilirubin: 0.5 mg/dL (ref 0.2–1.2)
Total Protein: 6.1 g/dL (ref 6.0–8.3)

## 2024-05-01 LAB — LIPID PANEL
Cholesterol: 128 mg/dL (ref 28–200)
HDL: 44.5 mg/dL
LDL Cholesterol: 69 mg/dL (ref 10–99)
NonHDL: 83.57
Total CHOL/HDL Ratio: 3
Triglycerides: 75 mg/dL (ref 10.0–149.0)
VLDL: 15 mg/dL (ref 0.0–40.0)

## 2024-05-01 LAB — VITAMIN B12: Vitamin B-12: 245 pg/mL (ref 211–911)

## 2024-05-01 LAB — HEMOGLOBIN A1C: Hgb A1c MFr Bld: 5.4 % (ref 4.6–6.5)

## 2024-05-01 LAB — T3, FREE: T3, Free: 3 pg/mL (ref 2.3–4.2)

## 2024-05-01 LAB — T4, FREE: Free T4: 0.86 ng/dL (ref 0.60–1.60)

## 2024-05-01 LAB — TSH: TSH: 2.11 u[IU]/mL (ref 0.35–5.50)

## 2024-05-01 NOTE — Assessment & Plan Note (Signed)
"   Chronic, worsening.  ? Due to past/ ongoing ETOH use.  Recommended cessation.  Will eval  with labs for secondary cause.  "

## 2024-05-01 NOTE — Progress Notes (Signed)
 "   Patient ID: Larry Lozano, male    DOB: 18-Apr-1961, 64 y.o.   MRN: [id number]  This visit was conducted in person.  BP 118/80   Pulse 90   Temp 98 F (36.7 C) (Oral)   Ht 5' 9 (1.753 m)   Wt 122 lb (55.3 kg)   SpO2 97%   BMI 18.02 kg/m    CC:  Chief Complaint  Patient presents with   Medical Management of Chronic Issues    Pt here for dysphagia x 2-3 mths    Subjective:   HPI: Larry Lozano is a 64 y.o. male presenting on 05/01/2024 for Medical Management of Chronic Issues (Pt here for dysphagia x 2-3 mths)  PCP [facility] Has not seen Matt since 2023.   He has noted swallowing difficulty in the last 2-3 months.   Solids are getting stuck in throat. Liquids go down well.   Sometime cough the food back up to chew it more.  Has noted some  numbness in hands and feet  up to knees for > 1 year.. Gradually worse in last few weeks. Using cane/walker.. unsteady gait, wide based. Hx of RA.   Hx of alcohol abuse.. currently less.. 12 beers in a week.   Denies reflux.    Wt Readings from Last 3 Encounters:  05/01/24 122 lb (55.3 kg)  11/06/22 122 lb 9.2 oz (55.6 kg)  09/21/21 126 lb 8 oz (57.4 kg)     Relevant past medical, surgical, family and social history reviewed and updated as indicated. Interim medical history since our last visit reviewed. Allergies and medications reviewed and updated. No outpatient medications prior to visit.   No facility-administered medications prior to visit.     Per HPI unless specifically indicated in ROS section below Review of Systems  Constitutional:  Positive for diaphoresis. Negative for fatigue and fever.  HENT:  Negative for ear pain.   Eyes:  Negative for pain.  Respiratory:  Negative for cough and shortness of breath.   Cardiovascular:  Negative for chest pain, palpitations and leg swelling.  Gastrointestinal:  Negative for abdominal pain.  Genitourinary:  Negative for dysuria.  Musculoskeletal:  Negative for  arthralgias.  Neurological:  Positive for numbness. Negative for syncope, weakness, light-headedness and headaches.  Psychiatric/Behavioral:  Negative for dysphoric mood.    Objective:  BP 118/80   Pulse 90   Temp 98 F (36.7 C) (Oral)   Ht 5' 9 (1.753 m)   Wt 122 lb (55.3 kg)   SpO2 97%   BMI 18.02 kg/m   Wt Readings from Last 3 Encounters:  05/01/24 122 lb (55.3 kg)  11/06/22 122 lb 9.2 oz (55.6 kg)  09/21/21 126 lb 8 oz (57.4 kg)      Physical Exam Constitutional:      Appearance: He is well-developed.  HENT:     Head: Normocephalic.     Right Ear: Hearing normal.     Left Ear: Hearing normal.     Nose: Nose normal.  Neck:     Thyroid: No thyroid mass or thyromegaly.     Vascular: No carotid bruit.     Trachea: Trachea normal.  Cardiovascular:     Rate and Rhythm: Normal rate and regular rhythm.     Pulses: Normal pulses.     Heart sounds: Heart sounds not distant. No murmur heard.    No friction rub. No gallop.     Comments: No peripheral edema Pulmonary:  Effort: Pulmonary effort is normal. No respiratory distress.     Breath sounds: Normal breath sounds.  Skin:    General: Skin is warm and dry.     Findings: No rash.  Neurological:     Mental Status: He is alert and oriented to person, place, and time.     Cranial Nerves: Cranial nerves 2-12 are intact.     Sensory: Sensory deficit present.     Motor: Motor function is intact.     Coordination: Romberg sign negative. Coordination abnormal.     Gait: Gait abnormal.     Comments:  Wide based gait, has cane  Psychiatric:        Speech: Speech normal.        Behavior: Behavior normal.        Thought Content: Thought content normal.       Results for orders placed or performed during the hospital encounter of 11/06/22  Rapid urine drug screen (hospital performed)   Collection Time: 11/06/22  2:02 AM  Result Value Ref Range   Opiates NONE DETECTED NONE DETECTED   Cocaine NONE DETECTED NONE DETECTED    Benzodiazepines POSITIVE (A) NONE DETECTED   Amphetamines NONE DETECTED NONE DETECTED   Tetrahydrocannabinol NONE DETECTED NONE DETECTED   Barbiturates NONE DETECTED NONE DETECTED  Osmolality, urine   Collection Time: 11/06/22  2:03 AM  Result Value Ref Range   Osmolality, Ur 327 300 - 900 mOsm/kg  Protime-INR   Collection Time: 11/06/22  4:36 PM  Result Value Ref Range   Prothrombin Time 13.6 11.4 - 15.2 seconds   INR 1.0 0.8 - 1.2  APTT   Collection Time: 11/06/22  4:36 PM  Result Value Ref Range   aPTT 27 24 - 36 seconds  CBC   Collection Time: 11/06/22  4:36 PM  Result Value Ref Range   WBC 12.0 (H) 4.0 - 10.5 K/uL   RBC 4.85 4.22 - 5.81 MIL/uL   Hemoglobin 15.5 13.0 - 17.0 g/dL   HCT 53.8 60.9 - 47.9 %   MCV 95.1 80.0 - 100.0 fL   MCH 32.0 26.0 - 34.0 pg   MCHC 33.6 30.0 - 36.0 g/dL   RDW 87.1 88.4 - 84.4 %   Platelets 172 150 - 400 K/uL   nRBC 0.0 0.0 - 0.2 %  Differential   Collection Time: 11/06/22  4:36 PM  Result Value Ref Range   Neutrophils Relative % 69 %   Neutro Abs 8.3 (H) 1.7 - 7.7 K/uL   Lymphocytes Relative 21 %   Lymphs Abs 2.5 0.7 - 4.0 K/uL   Monocytes Relative 9 %   Monocytes Absolute 1.1 (H) 0.1 - 1.0 K/uL   Eosinophils Relative 0 %   Eosinophils Absolute 0.0 0.0 - 0.5 K/uL   Basophils Relative 1 %   Basophils Absolute 0.1 0.0 - 0.1 K/uL   Immature Granulocytes 0 %   Abs Immature Granulocytes 0.03 0.00 - 0.07 K/uL  Comprehensive metabolic panel   Collection Time: 11/06/22  4:36 PM  Result Value Ref Range   Sodium 134 (L) 135 - 145 mmol/L   Potassium 3.8 3.5 - 5.1 mmol/L   Chloride 98 98 - 111 mmol/L   CO2 9 (L) 22 - 32 mmol/L   Glucose, Bld 108 (H) 70 - 99 mg/dL   BUN 5 (L) 6 - 20 mg/dL   Creatinine, Ser 9.03 0.61 - 1.24 mg/dL   Calcium 9.7 8.9 - 89.6 mg/dL   Total Protein 6.6  6.5 - 8.1 g/dL   Albumin 4.4 3.5 - 5.0 g/dL   AST 63 (H) 15 - 41 U/L   ALT 63 (H) 0 - 44 U/L   Alkaline Phosphatase 73 38 - 126 U/L   Total Bilirubin 0.4  0.3 - 1.2 mg/dL   GFR, Estimated >39 >39 mL/min   Anion gap 27 (H) 5 - 15  Ethanol   Collection Time: 11/06/22  4:36 PM  Result Value Ref Range   Alcohol, Ethyl (B) 15 (H) <10 mg/dL  I-stat chem 8, ED   Collection Time: 11/06/22  4:41 PM  Result Value Ref Range   Sodium 134 (L) 135 - 145 mmol/L   Potassium 3.8 3.5 - 5.1 mmol/L   Chloride 102 98 - 111 mmol/L   BUN 4 (L) 6 - 20 mg/dL   Creatinine, Ser 9.19 0.61 - 1.24 mg/dL   Glucose, Bld 894 (H) 70 - 99 mg/dL   Calcium, Ion 8.79 1.15 - 1.40 mmol/L   TCO2 11 (L) 22 - 32 mmol/L   Hemoglobin 17.0 13.0 - 17.0 g/dL   HCT 49.9 60.9 - 47.9 %  CBG monitoring, ED   Collection Time: 11/06/22  6:10 PM  Result Value Ref Range   Glucose-Capillary 97 70 - 99 mg/dL  Lactic acid, plasma   Collection Time: 11/06/22  8:52 PM  Result Value Ref Range   Lactic Acid, Venous 1.9 0.5 - 1.9 mmol/L  Basic metabolic panel   Collection Time: 11/06/22  8:53 PM  Result Value Ref Range   Sodium 135 135 - 145 mmol/L   Potassium 4.2 3.5 - 5.1 mmol/L   Chloride 100 98 - 111 mmol/L   CO2 24 22 - 32 mmol/L   Glucose, Bld 101 (H) 70 - 99 mg/dL   BUN <5 (L) 6 - 20 mg/dL   Creatinine, Ser 9.19 0.61 - 1.24 mg/dL   Calcium 9.4 8.9 - 89.6 mg/dL   GFR, Estimated >39 >39 mL/min   Anion gap 11 5 - 15  CK   Collection Time: 11/06/22  8:53 PM  Result Value Ref Range   Total CK 117 49 - 397 U/L  Magnesium   Collection Time: 11/06/22  8:53 PM  Result Value Ref Range   Magnesium 1.8 1.7 - 2.4 mg/dL  Phosphorus   Collection Time: 11/06/22  8:53 PM  Result Value Ref Range   Phosphorus 3.3 2.5 - 4.6 mg/dL  Ammonia   Collection Time: 11/06/22  8:53 PM  Result Value Ref Range   Ammonia 25 9 - 35 umol/L  Beta-hydroxybutyric acid   Collection Time: 11/06/22  8:53 PM  Result Value Ref Range   Beta-Hydroxybutyric Acid 1.67 (H) 0.05 - 0.27 mmol/L  Vitamin B1   Collection Time: 11/06/22  8:53 PM  Result Value Ref Range   Vitamin B1 (Thiamine ) 155.4 66.5 - 200.0  nmol/L  Salicylate level   Collection Time: 11/06/22  8:53 PM  Result Value Ref Range   Salicylate Lvl <7.0 (L) 7.0 - 30.0 mg/dL  Volatiles,Blood (acetone,ethanol,isoprop,methanol)   Collection Time: 11/06/22  8:53 PM  Result Value Ref Range   Acetone, blood <0.010 0.000 - 0.010 g/dL   Ethanol, blood <9.989 0.000 - 0.010 g/dL   Isopropanol, blood <9.989 0.000 - 0.010 g/dL   Methanol, blood <9.989 0.000 - 0.010 g/dL  Ethylene glycol   Collection Time: 11/06/22  8:53 PM  Result Value Ref Range   Ethylene Glycol Lvl <5 None detected mg/dL  Procalcitonin   Collection  Time: 11/06/22  8:53 PM  Result Value Ref Range   Procalcitonin <0.10 ng/mL  Osmolality   Collection Time: 11/06/22  8:53 PM  Result Value Ref Range   Osmolality 284 275 - 295 mOsm/kg  TSH   Collection Time: 11/06/22  8:53 PM  Result Value Ref Range   TSH 2.223 0.350 - 4.500 uIU/mL  Lipase, blood   Collection Time: 11/06/22  8:53 PM  Result Value Ref Range   Lipase 88 (H) 11 - 51 U/L  I-Stat venous blood gas, ED   Collection Time: 11/06/22  9:12 PM  Result Value Ref Range   pH, Ven 7.311 7.25 - 7.43   pCO2, Ven 45.9 44 - 60 mmHg   pO2, Ven 39 32 - 45 mmHg   Bicarbonate 23.1 20.0 - 28.0 mmol/L   TCO2 25 22 - 32 mmol/L   O2 Saturation 69 %   Acid-base deficit 3.0 (H) 0.0 - 2.0 mmol/L   Sodium 136 135 - 145 mmol/L   Potassium 4.2 3.5 - 5.1 mmol/L   Calcium, Ion 1.27 1.15 - 1.40 mmol/L   HCT 46.0 39.0 - 52.0 %   Hemoglobin 15.6 13.0 - 17.0 g/dL   Sample type VENOUS    Comment NOTIFIED PHYSICIAN   Prealbumin   Collection Time: 11/07/22  8:21 AM  Result Value Ref Range   Prealbumin 19 18 - 38 mg/dL    Assessment and Plan  Numbness and tingling of upper and lower extremities of both sides Assessment & Plan:  Chronic, worsening.  ? Due to past/ ongoing ETOH use.  Recommended cessation.  Will eval  with labs for secondary cause.   Orders: -     Hemoglobin A1c -     Comprehensive metabolic panel with  GFR -     Vitamin B12 -     CBC with Differential/Platelet -     TSH -     T4, free -     T3, free  Dysphagia, unspecified type Assessment & Plan: New, denies GERD. Unclear etiology.. DDx included stricture, esophagitis etc.  Will refer for swallowing study to begin assessment. Did not start PPI as pt denies reflux.. but this could be considered.  Likely will need GI referral for endoscopy etc.   Unstable gait Assessment & Plan: Unclear if baseline for pt.   Screening cholesterol level -     Lipid panel  PT HAS NOT HAS CPX SINCE 2023.SABRA HAS NOT SEEN PCP... RECOMMENDED RETURNING IN 4-6 WEEKS  TO ESSENTIALLY REESTABLISH AND CONTINUE TO PURSUE MULTIPLE HEALTH ISSUES.  Return in about 6 weeks (around 06/12/2024) for  multiple health issues... needs to get back in with PCP.   Greig Ring, MD  "

## 2024-05-01 NOTE — Assessment & Plan Note (Signed)
 Unclear if baseline for pt.

## 2024-05-01 NOTE — Assessment & Plan Note (Addendum)
 New, denies GERD. Unclear etiology.. DDx included stricture, esophagitis etc.  Will refer for swallowing study to begin assessment. Did not start PPI as pt denies reflux.. but this could be considered.  Likely will need GI referral for endoscopy etc.

## 2024-05-02 ENCOUNTER — Ambulatory Visit: Payer: Self-pay | Admitting: Family Medicine

## 2024-05-02 ENCOUNTER — Telehealth (HOSPITAL_COMMUNITY): Payer: Self-pay | Admitting: *Deleted

## 2024-05-02 NOTE — Telephone Encounter (Signed)
 Attempted to contact patient to schedule OP MBS. Left VM @ [phone] requesting a call back. RKEEL

## 2024-05-09 ENCOUNTER — Telehealth (HOSPITAL_COMMUNITY): Payer: Self-pay

## 2024-05-09 NOTE — Telephone Encounter (Signed)
 2nd attempt to contact patient to schedule OP MBS. Left VM @ [phone] requesting a call back to cancel order or move forward with scheduling.

## 2024-05-16 ENCOUNTER — Telehealth (HOSPITAL_COMMUNITY): Payer: Self-pay

## 2024-05-16 NOTE — Telephone Encounter (Signed)
 3rd attempt to contact patient to schedule OP MBS. Left VM @ [phone] requesting a call back to schedule.

## 2024-05-23 ENCOUNTER — Telehealth (HOSPITAL_COMMUNITY): Payer: Self-pay

## 2024-05-23 NOTE — Telephone Encounter (Signed)
 Attempted to contact patient to schedule OP MBS (swallow study) multiple times. Left voicemails on 1/15, 1/22, and 1/29 with our office call back number. Order now canceled. Patient will need to seek out new order from MD's office if they wish to proceed with the swallow study.

## 2024-06-12 ENCOUNTER — Ambulatory Visit: Admitting: Nurse Practitioner
